# Patient Record
Sex: Female | Born: 1935 | Race: White | Hispanic: No | Smoking: Never smoker
Health system: Southern US, Community
[De-identification: ages and names within clinical notes are randomized; demographics above are authoritative.]

## PROBLEM LIST (undated history)

## (undated) DIAGNOSIS — J189 Pneumonia, unspecified organism: Secondary | ICD-10-CM

## (undated) DIAGNOSIS — G43909 Migraine, unspecified, not intractable, without status migrainosus: Secondary | ICD-10-CM

## (undated) DIAGNOSIS — J40 Bronchitis, not specified as acute or chronic: Secondary | ICD-10-CM

## (undated) DIAGNOSIS — H409 Unspecified glaucoma: Secondary | ICD-10-CM

## (undated) DIAGNOSIS — E785 Hyperlipidemia, unspecified: Secondary | ICD-10-CM

## (undated) DIAGNOSIS — M810 Age-related osteoporosis without current pathological fracture: Secondary | ICD-10-CM

## (undated) DIAGNOSIS — I10 Essential (primary) hypertension: Secondary | ICD-10-CM

## (undated) HISTORY — DX: Essential (primary) hypertension: I10

## (undated) HISTORY — DX: Hyperlipidemia, unspecified: E78.5

## (undated) HISTORY — DX: Migraine, unspecified, not intractable, without status migrainosus: G43.909

## (undated) HISTORY — DX: Unspecified glaucoma: H40.9

## (undated) HISTORY — DX: Bronchitis, not specified as acute or chronic: J40

## (undated) HISTORY — DX: Age-related osteoporosis without current pathological fracture: M81.0

## (undated) HISTORY — PX: APPENDECTOMY: SHX54

## (undated) HISTORY — DX: Pneumonia, unspecified organism: J18.9

---

## 1990-12-16 HISTORY — PX: VAGINAL HYSTERECTOMY: SUR661

## 1998-03-23 ENCOUNTER — Inpatient Hospital Stay (HOSPITAL_COMMUNITY): Admission: AD | Admit: 1998-03-23 | Discharge: 1998-03-25 | Payer: Self-pay

## 1999-01-25 ENCOUNTER — Other Ambulatory Visit: Admission: RE | Admit: 1999-01-25 | Discharge: 1999-01-25 | Payer: Self-pay | Admitting: Obstetrics and Gynecology

## 1999-11-26 ENCOUNTER — Encounter: Payer: Self-pay | Admitting: Internal Medicine

## 1999-11-26 ENCOUNTER — Ambulatory Visit (HOSPITAL_COMMUNITY): Admission: RE | Admit: 1999-11-26 | Discharge: 1999-11-26 | Payer: Self-pay | Admitting: Internal Medicine

## 2002-02-12 ENCOUNTER — Emergency Department (HOSPITAL_COMMUNITY): Admission: EM | Admit: 2002-02-12 | Discharge: 2002-02-12 | Payer: Self-pay | Admitting: Emergency Medicine

## 2003-08-01 ENCOUNTER — Ambulatory Visit (HOSPITAL_COMMUNITY): Admission: RE | Admit: 2003-08-01 | Discharge: 2003-08-01 | Payer: Self-pay | Admitting: Gastroenterology

## 2008-06-24 ENCOUNTER — Encounter: Admission: RE | Admit: 2008-06-24 | Discharge: 2008-06-24 | Payer: Self-pay | Admitting: Family Medicine

## 2010-11-01 ENCOUNTER — Ambulatory Visit (HOSPITAL_COMMUNITY)
Admission: RE | Admit: 2010-11-01 | Discharge: 2010-11-01 | Disposition: A | Discharge status: Home / Self Care | Payer: Medicare Other | Source: Ambulatory Visit | Attending: Gastroenterology | Admitting: Gastroenterology

## 2010-11-01 DIAGNOSIS — R195 Other fecal abnormalities: Secondary | ICD-10-CM | POA: Insufficient documentation

## 2010-11-01 DIAGNOSIS — G43909 Migraine, unspecified, not intractable, without status migrainosus: Secondary | ICD-10-CM | POA: Insufficient documentation

## 2010-11-01 DIAGNOSIS — E039 Hypothyroidism, unspecified: Secondary | ICD-10-CM | POA: Insufficient documentation

## 2010-11-01 DIAGNOSIS — M810 Age-related osteoporosis without current pathological fracture: Secondary | ICD-10-CM | POA: Insufficient documentation

## 2010-11-01 DIAGNOSIS — I10 Essential (primary) hypertension: Secondary | ICD-10-CM | POA: Insufficient documentation

## 2010-11-01 DIAGNOSIS — Z5309 Procedure and treatment not carried out because of other contraindication: Secondary | ICD-10-CM | POA: Insufficient documentation

## 2010-11-02 LAB — POCT I-STAT 4, (NA,K, GLUC, HGB,HCT)
Glucose, Bld: 98 mg/dL (ref 70–99)
HCT: 37 % (ref 36.0–46.0)
Hemoglobin: 12.6 g/dL (ref 12.0–15.0)
Potassium: 3.9 mEq/L (ref 3.5–5.1)
Sodium: 141 mEq/L (ref 135–145)

## 2010-11-02 NOTE — Op Note (Signed)
NAME:  ASHEY, TRAMONTANA                          ACCOUNT NO.:  [id number]   MEDICAL RECORD NO.:  [id number]                   PATIENT TYPE:  AMB   LOCATION:  ENDO                                 FACILITY:  [facility]   PHYSICIAN:  Danise Edge, M.D.                DATE OF BIRTH:  11-16-1922   DATE OF PROCEDURE:  08/01/2003  DATE OF DISCHARGE:                                 OPERATIVE REPORT   PROCEDURE:  Incomplete colonoscopy.   PROCEDURE INDICATION:  Ms. Rhesa Forsberg is a 78 year old female born 1923-01-14.  Ms. Schnackenberg is scheduled to undergo a screening colonoscopy with  polypectomy to prevent colon cancer.  In 1999 her screening flexible  proctosigmoidoscopy was normal.  She has undergone a hysterectomy.   ENDOSCOPIST:  Danise Edge, M.D.   PREMEDICATION:  Demerol 50 mg, Versed 3 mg.   DESCRIPTION OF PROCEDURE:  After obtaining informed consent, Ms. Sanjurjo was  placed in the left lateral decubitus position.  I administered intravenous  Demerol and intravenous Versed to achieve conscious sedation for the  procedure.  The patient's blood pressure, oxygen saturation, and cardiac  rhythm were monitored throughout the procedure and documented in the medical  record.   Anal inspection was normal.  Digital rectal exam was normal.  The Olympus  adjustable pediatric colonoscope was introduced into the rectum and only  advanced to 30 cm from the anal verge.  Due to colonic loop formation which  could not be controlled with applying external abdominal pressure and  repositioning the patient from the left lateral decubitus position to the  supine and finally the right lateral decubitus position, a complete  colonoscopy was not performed.  Colonic preparation for the exam today was  excellent.   Endoscopic appearance of the rectum and distal sigmoid colon with the  colonoscope advanced to 30 cm from the anal verge was completely normal.  There was no endoscopic evidence for the  presence of colorectal neoplasia.   RECOMMENDATIONS:  Perform stool Hemoccult cards.  If positive, schedule air  contrast barium enema.                                               Danise Edge, M.D.    MJ/MEDQ  D:  08/01/2003  T:  08/01/2003  Job:  [id number]   cc:   Dellis Anes. Idell Pickles, M.D.  [address]  [place]  [place] [address]  Fax: [phone]

## 2010-11-15 NOTE — Op Note (Addendum)
  NAMEMarland Kitchen  VALORY, WETHERBY NO.:  [id number]  MEDICAL RECORD NO.:  [id number]  LOCATION:  WLEN                         FACILITY:  [facility]  PHYSICIAN:  Danise Edge, M.D.   DATE OF BIRTH:  05-Oct-1922  DATE OF PROCEDURE:  11/01/2010 DATE OF DISCHARGE:                              OPERATIVE REPORT   HISTORY:  Emily Jackson is an 77 year old female born [birth year]. In February 2005, the patient underwent a normal screening flexible proctosigmoidoscopy.  A screening colonoscopy could not be performed due to sigmoid colon loop formation.  The patient submitted stool for Hemoccult testing associated with her health maintenance physical examination.  Her stool was Hemoccult positive and her hemoglobin was normal.  The patient denied abdominal pain, diarrhea, constipation, or gastrointestinal bleeding.  The patient was scheduled to undergo a diagnostic colonoscopy to evaluate Hemoccult-positive stool.  The colonoscopy could not be performed due to a poorly prepped colon, despite the patient consuming MoviPrep.  ENDOSCOPIST:  Danise Edge, M.D.  PREMEDICATION:  Propofol administered by Anesthesia.  Anal inspection and digital rectal examination were normal.  The Pentax pediatric colonoscope was introduced into the rectum revealing a large amount of solid stool and liquid stool, which could not be cleared.  The procedure was cancelled.  ASSESSMENT: 1. Unexplained Hemoccult-positive stool with normal hemoglobin. 2. Aborted diagnostic colonoscopy due to inadequately prepped colon.  RECOMMENDATIONS:  I will meet with Emily Jackson and discuss a different colon preparation and schedule a repeat colonoscopy.          ______________________________ Danise Edge, M.D.     MJ/MEDQ  D:  11/01/2010  T:  11/01/2010  Job:  [id number]  Electronically Signed by Danise Edge M.D. on 12/27/2010 03:55:57 PM

## 2010-12-13 ENCOUNTER — Emergency Department (HOSPITAL_COMMUNITY)
Admission: EM | Admit: 2010-12-13 | Discharge: 2010-12-13 | Disposition: A | Discharge status: Home / Self Care | Payer: Medicare Other | Attending: Emergency Medicine | Admitting: Emergency Medicine

## 2010-12-13 ENCOUNTER — Ambulatory Visit (HOSPITAL_COMMUNITY)
Admission: RE | Admit: 2010-12-13 | Discharge: 2010-12-13 | Disposition: A | Discharge status: Home / Self Care | Payer: Medicare Other | Source: Ambulatory Visit | Attending: Gastroenterology | Admitting: Gastroenterology

## 2010-12-13 DIAGNOSIS — Z5309 Procedure and treatment not carried out because of other contraindication: Secondary | ICD-10-CM | POA: Insufficient documentation

## 2010-12-13 DIAGNOSIS — R9431 Abnormal electrocardiogram [ECG] [EKG]: Secondary | ICD-10-CM | POA: Insufficient documentation

## 2010-12-13 DIAGNOSIS — K921 Melena: Secondary | ICD-10-CM | POA: Insufficient documentation

## 2010-12-13 DIAGNOSIS — I10 Essential (primary) hypertension: Secondary | ICD-10-CM | POA: Insufficient documentation

## 2010-12-13 DIAGNOSIS — I4891 Unspecified atrial fibrillation: Secondary | ICD-10-CM | POA: Insufficient documentation

## 2010-12-13 DIAGNOSIS — E876 Hypokalemia: Secondary | ICD-10-CM | POA: Insufficient documentation

## 2010-12-13 DIAGNOSIS — R002 Palpitations: Secondary | ICD-10-CM | POA: Insufficient documentation

## 2010-12-13 LAB — DIFFERENTIAL
Basophils Absolute: 0 10*3/uL (ref 0.0–0.1)
Basophils Relative: 1 % (ref 0–1)
Eosinophils Absolute: 0.1 10*3/uL (ref 0.0–0.7)
Eosinophils Relative: 1 % (ref 0–5)
Lymphocytes Relative: 29 % (ref 12–46)
Lymphs Abs: 2.2 10*3/uL (ref 0.7–4.0)
Monocytes Absolute: 0.4 10*3/uL (ref 0.1–1.0)
Monocytes Relative: 5 % (ref 3–12)
Neutro Abs: 4.7 10*3/uL (ref 1.7–7.7)
Neutrophils Relative %: 64 % (ref 43–77)

## 2010-12-13 LAB — COMPREHENSIVE METABOLIC PANEL
ALT: 19 U/L (ref 0–35)
Albumin: 4.7 g/dL (ref 3.5–5.2)
Alkaline Phosphatase: 63 U/L (ref 39–117)
BUN: 12 mg/dL (ref 6–23)
Chloride: 100 mEq/L (ref 96–112)
Potassium: 3.2 mEq/L — ABNORMAL LOW (ref 3.5–5.1)
Sodium: 141 mEq/L (ref 135–145)
Total Bilirubin: 0.6 mg/dL (ref 0.3–1.2)
Total Protein: 7.3 g/dL (ref 6.0–8.3)

## 2010-12-13 LAB — CBC
HCT: 36.6 % (ref 36.0–46.0)
Hemoglobin: 13 g/dL (ref 12.0–15.0)
MCHC: 35.5 g/dL (ref 30.0–36.0)
RDW: 12.3 % (ref 11.5–15.5)
WBC: 7.3 10*3/uL (ref 4.0–10.5)

## 2010-12-13 LAB — TROPONIN I: Troponin I: <0.3 ng/mL (ref <0.30)

## 2010-12-13 LAB — MAGNESIUM: Magnesium: 2 mg/dL (ref 1.5–2.5)

## 2011-01-16 LAB — HM COLONOSCOPY: HM Colonoscopy: NORMAL

## 2011-01-31 ENCOUNTER — Ambulatory Visit (HOSPITAL_COMMUNITY): Payer: Medicare Other

## 2011-01-31 ENCOUNTER — Ambulatory Visit (HOSPITAL_COMMUNITY)
Admission: RE | Admit: 2011-01-31 | Discharge: 2011-01-31 | Disposition: A | Discharge status: Home / Self Care | Payer: Medicare Other | Source: Ambulatory Visit | Attending: Gastroenterology | Admitting: Gastroenterology

## 2011-01-31 DIAGNOSIS — I10 Essential (primary) hypertension: Secondary | ICD-10-CM | POA: Insufficient documentation

## 2011-01-31 DIAGNOSIS — K5730 Diverticulosis of large intestine without perforation or abscess without bleeding: Secondary | ICD-10-CM | POA: Insufficient documentation

## 2011-01-31 DIAGNOSIS — E039 Hypothyroidism, unspecified: Secondary | ICD-10-CM | POA: Insufficient documentation

## 2011-01-31 DIAGNOSIS — Z79899 Other long term (current) drug therapy: Secondary | ICD-10-CM | POA: Insufficient documentation

## 2011-01-31 DIAGNOSIS — K921 Melena: Secondary | ICD-10-CM

## 2011-01-31 DIAGNOSIS — E785 Hyperlipidemia, unspecified: Secondary | ICD-10-CM | POA: Insufficient documentation

## 2011-01-31 DIAGNOSIS — R195 Other fecal abnormalities: Secondary | ICD-10-CM | POA: Insufficient documentation

## 2011-02-07 NOTE — Op Note (Signed)
  Emily Jackson, Emily Jackson NO.:  [id number]  MEDICAL RECORD NO.:  [id number]  LOCATION:  WLEN                         FACILITY:  [facility]  PHYSICIAN:  Danise Edge, M.D.   DATE OF BIRTH:  Dec 14, 1922  DATE OF PROCEDURE:  01/31/2011 DATE OF DISCHARGE:                              OPERATIVE REPORT   REFERRING PHYSICIAN:  Dr. Juluis Rainier  PROCEDURE:  Diagnostic flexible proctosigmoidoscopy.  HISTORY:  Ms. Emily Jackson is an 77 year old female born on 21-Aug-1922. The patient was scheduled to undergo a diagnostic colonoscopy to evaluate Hemoccult-positive stool.  I could only perform a flexible proctosigmoidoscopy to 30 cm due to colonic loop formation.  ENDOSCOPIST:  Danise Edge, MD  PREMEDICATION:  Propofol administered by Anesthesia.  PROCEDURE IN DETAIL:  Anal inspection and digital rectal exam were normal.  A Pentax pediatric colonoscope was introduced into the rectum and advanced to 30 cm from the anal verge.  Due to colonic loop formation, advancement of the endoscope more proximally was not possible.  Colonic preparation was good.  Endoscopic appearance of the rectum and distal sigmoid colon was normal. There was no evidence of colorectal neoplasia.  PLAN:  The patient will be referred to the radiology suite for an air- contrast barium enema.          ______________________________ Danise Edge, M.D.     MJ/MEDQ  D:  01/31/2011  T:  02/01/2011  Job:  [id number]  cc:   Dr. Juluis Rainier  Electronically Signed by Danise Edge M.D. on 02/07/2011 04:48:04 PM

## 2011-02-15 ENCOUNTER — Other Ambulatory Visit: Payer: Medicare Other

## 2011-02-21 ENCOUNTER — Other Ambulatory Visit: Payer: Medicare Other

## 2011-02-22 ENCOUNTER — Ambulatory Visit: Admission: RE | Admit: 2011-02-22 | Payer: Medicare Other | Source: Ambulatory Visit

## 2011-02-27 LAB — HM DEXA SCAN

## 2011-06-20 DIAGNOSIS — M9981 Other biomechanical lesions of cervical region: Secondary | ICD-10-CM | POA: Diagnosis not present

## 2011-06-20 DIAGNOSIS — M999 Biomechanical lesion, unspecified: Secondary | ICD-10-CM | POA: Diagnosis not present

## 2011-06-20 DIAGNOSIS — M5030 Other cervical disc degeneration, unspecified cervical region: Secondary | ICD-10-CM | POA: Diagnosis not present

## 2011-06-26 DIAGNOSIS — Z124 Encounter for screening for malignant neoplasm of cervix: Secondary | ICD-10-CM | POA: Diagnosis not present

## 2011-06-26 DIAGNOSIS — Z01419 Encounter for gynecological examination (general) (routine) without abnormal findings: Secondary | ICD-10-CM | POA: Diagnosis not present

## 2011-07-09 DIAGNOSIS — M999 Biomechanical lesion, unspecified: Secondary | ICD-10-CM | POA: Diagnosis not present

## 2011-07-09 DIAGNOSIS — M9981 Other biomechanical lesions of cervical region: Secondary | ICD-10-CM | POA: Diagnosis not present

## 2011-07-09 DIAGNOSIS — M5030 Other cervical disc degeneration, unspecified cervical region: Secondary | ICD-10-CM | POA: Diagnosis not present

## 2011-07-31 DIAGNOSIS — M5030 Other cervical disc degeneration, unspecified cervical region: Secondary | ICD-10-CM | POA: Diagnosis not present

## 2011-07-31 DIAGNOSIS — M999 Biomechanical lesion, unspecified: Secondary | ICD-10-CM | POA: Diagnosis not present

## 2011-07-31 DIAGNOSIS — M9981 Other biomechanical lesions of cervical region: Secondary | ICD-10-CM | POA: Diagnosis not present

## 2011-08-14 DIAGNOSIS — Z0000 Encounter for general adult medical examination without abnormal findings: Secondary | ICD-10-CM | POA: Diagnosis not present

## 2011-08-14 DIAGNOSIS — E785 Hyperlipidemia, unspecified: Secondary | ICD-10-CM | POA: Diagnosis not present

## 2011-08-14 DIAGNOSIS — Z23 Encounter for immunization: Secondary | ICD-10-CM | POA: Diagnosis not present

## 2011-08-14 DIAGNOSIS — Z79899 Other long term (current) drug therapy: Secondary | ICD-10-CM | POA: Diagnosis not present

## 2011-08-14 DIAGNOSIS — E039 Hypothyroidism, unspecified: Secondary | ICD-10-CM | POA: Diagnosis not present

## 2011-08-14 DIAGNOSIS — M810 Age-related osteoporosis without current pathological fracture: Secondary | ICD-10-CM | POA: Diagnosis not present

## 2011-08-14 DIAGNOSIS — I10 Essential (primary) hypertension: Secondary | ICD-10-CM | POA: Diagnosis not present

## 2011-08-15 DIAGNOSIS — M999 Biomechanical lesion, unspecified: Secondary | ICD-10-CM | POA: Diagnosis not present

## 2011-08-15 DIAGNOSIS — M5030 Other cervical disc degeneration, unspecified cervical region: Secondary | ICD-10-CM | POA: Diagnosis not present

## 2011-08-15 DIAGNOSIS — M9981 Other biomechanical lesions of cervical region: Secondary | ICD-10-CM | POA: Diagnosis not present

## 2011-08-28 DIAGNOSIS — K130 Diseases of lips: Secondary | ICD-10-CM | POA: Diagnosis not present

## 2011-08-28 DIAGNOSIS — L570 Actinic keratosis: Secondary | ICD-10-CM | POA: Diagnosis not present

## 2011-09-02 DIAGNOSIS — E782 Mixed hyperlipidemia: Secondary | ICD-10-CM | POA: Diagnosis not present

## 2011-09-02 DIAGNOSIS — I10 Essential (primary) hypertension: Secondary | ICD-10-CM | POA: Diagnosis not present

## 2011-09-03 DIAGNOSIS — M5030 Other cervical disc degeneration, unspecified cervical region: Secondary | ICD-10-CM | POA: Diagnosis not present

## 2011-09-03 DIAGNOSIS — M999 Biomechanical lesion, unspecified: Secondary | ICD-10-CM | POA: Diagnosis not present

## 2011-09-03 DIAGNOSIS — M9981 Other biomechanical lesions of cervical region: Secondary | ICD-10-CM | POA: Diagnosis not present

## 2011-09-18 DIAGNOSIS — H409 Unspecified glaucoma: Secondary | ICD-10-CM | POA: Diagnosis not present

## 2011-09-18 DIAGNOSIS — H4011X0 Primary open-angle glaucoma, stage unspecified: Secondary | ICD-10-CM | POA: Diagnosis not present

## 2011-09-18 DIAGNOSIS — Z961 Presence of intraocular lens: Secondary | ICD-10-CM | POA: Diagnosis not present

## 2011-09-24 DIAGNOSIS — M999 Biomechanical lesion, unspecified: Secondary | ICD-10-CM | POA: Diagnosis not present

## 2011-09-24 DIAGNOSIS — M9981 Other biomechanical lesions of cervical region: Secondary | ICD-10-CM | POA: Diagnosis not present

## 2011-09-24 DIAGNOSIS — M5030 Other cervical disc degeneration, unspecified cervical region: Secondary | ICD-10-CM | POA: Diagnosis not present

## 2011-10-15 DIAGNOSIS — M999 Biomechanical lesion, unspecified: Secondary | ICD-10-CM | POA: Diagnosis not present

## 2011-10-15 DIAGNOSIS — M9981 Other biomechanical lesions of cervical region: Secondary | ICD-10-CM | POA: Diagnosis not present

## 2011-10-15 DIAGNOSIS — M5030 Other cervical disc degeneration, unspecified cervical region: Secondary | ICD-10-CM | POA: Diagnosis not present

## 2011-10-29 DIAGNOSIS — M9981 Other biomechanical lesions of cervical region: Secondary | ICD-10-CM | POA: Diagnosis not present

## 2011-10-29 DIAGNOSIS — M5030 Other cervical disc degeneration, unspecified cervical region: Secondary | ICD-10-CM | POA: Diagnosis not present

## 2011-10-29 DIAGNOSIS — M999 Biomechanical lesion, unspecified: Secondary | ICD-10-CM | POA: Diagnosis not present

## 2011-11-21 DIAGNOSIS — N189 Chronic kidney disease, unspecified: Secondary | ICD-10-CM | POA: Diagnosis not present

## 2011-11-26 DIAGNOSIS — M9981 Other biomechanical lesions of cervical region: Secondary | ICD-10-CM | POA: Diagnosis not present

## 2011-11-26 DIAGNOSIS — M999 Biomechanical lesion, unspecified: Secondary | ICD-10-CM | POA: Diagnosis not present

## 2011-11-26 DIAGNOSIS — M5030 Other cervical disc degeneration, unspecified cervical region: Secondary | ICD-10-CM | POA: Diagnosis not present

## 2011-12-04 DIAGNOSIS — M9981 Other biomechanical lesions of cervical region: Secondary | ICD-10-CM | POA: Diagnosis not present

## 2011-12-04 DIAGNOSIS — M999 Biomechanical lesion, unspecified: Secondary | ICD-10-CM | POA: Diagnosis not present

## 2011-12-04 DIAGNOSIS — M5030 Other cervical disc degeneration, unspecified cervical region: Secondary | ICD-10-CM | POA: Diagnosis not present

## 2011-12-31 DIAGNOSIS — M9981 Other biomechanical lesions of cervical region: Secondary | ICD-10-CM | POA: Diagnosis not present

## 2011-12-31 DIAGNOSIS — M999 Biomechanical lesion, unspecified: Secondary | ICD-10-CM | POA: Diagnosis not present

## 2011-12-31 DIAGNOSIS — M5030 Other cervical disc degeneration, unspecified cervical region: Secondary | ICD-10-CM | POA: Diagnosis not present

## 2012-01-14 DIAGNOSIS — M5030 Other cervical disc degeneration, unspecified cervical region: Secondary | ICD-10-CM | POA: Diagnosis not present

## 2012-01-14 DIAGNOSIS — M9981 Other biomechanical lesions of cervical region: Secondary | ICD-10-CM | POA: Diagnosis not present

## 2012-01-14 DIAGNOSIS — M999 Biomechanical lesion, unspecified: Secondary | ICD-10-CM | POA: Diagnosis not present

## 2012-02-04 DIAGNOSIS — Z961 Presence of intraocular lens: Secondary | ICD-10-CM | POA: Diagnosis not present

## 2012-02-04 DIAGNOSIS — H4011X0 Primary open-angle glaucoma, stage unspecified: Secondary | ICD-10-CM | POA: Diagnosis not present

## 2012-02-04 DIAGNOSIS — H409 Unspecified glaucoma: Secondary | ICD-10-CM | POA: Diagnosis not present

## 2012-02-05 DIAGNOSIS — M9981 Other biomechanical lesions of cervical region: Secondary | ICD-10-CM | POA: Diagnosis not present

## 2012-02-05 DIAGNOSIS — M999 Biomechanical lesion, unspecified: Secondary | ICD-10-CM | POA: Diagnosis not present

## 2012-02-05 DIAGNOSIS — M5030 Other cervical disc degeneration, unspecified cervical region: Secondary | ICD-10-CM | POA: Diagnosis not present

## 2012-02-19 DIAGNOSIS — M5030 Other cervical disc degeneration, unspecified cervical region: Secondary | ICD-10-CM | POA: Diagnosis not present

## 2012-02-19 DIAGNOSIS — M9981 Other biomechanical lesions of cervical region: Secondary | ICD-10-CM | POA: Diagnosis not present

## 2012-02-19 DIAGNOSIS — M999 Biomechanical lesion, unspecified: Secondary | ICD-10-CM | POA: Diagnosis not present

## 2012-03-05 DIAGNOSIS — M9981 Other biomechanical lesions of cervical region: Secondary | ICD-10-CM | POA: Diagnosis not present

## 2012-03-05 DIAGNOSIS — M5030 Other cervical disc degeneration, unspecified cervical region: Secondary | ICD-10-CM | POA: Diagnosis not present

## 2012-03-05 DIAGNOSIS — M999 Biomechanical lesion, unspecified: Secondary | ICD-10-CM | POA: Diagnosis not present

## 2012-03-19 DIAGNOSIS — Z23 Encounter for immunization: Secondary | ICD-10-CM | POA: Diagnosis not present

## 2012-03-26 DIAGNOSIS — M9981 Other biomechanical lesions of cervical region: Secondary | ICD-10-CM | POA: Diagnosis not present

## 2012-03-26 DIAGNOSIS — M5030 Other cervical disc degeneration, unspecified cervical region: Secondary | ICD-10-CM | POA: Diagnosis not present

## 2012-03-26 DIAGNOSIS — M999 Biomechanical lesion, unspecified: Secondary | ICD-10-CM | POA: Diagnosis not present

## 2012-04-09 DIAGNOSIS — M999 Biomechanical lesion, unspecified: Secondary | ICD-10-CM | POA: Diagnosis not present

## 2012-04-09 DIAGNOSIS — M9981 Other biomechanical lesions of cervical region: Secondary | ICD-10-CM | POA: Diagnosis not present

## 2012-04-09 DIAGNOSIS — M5030 Other cervical disc degeneration, unspecified cervical region: Secondary | ICD-10-CM | POA: Diagnosis not present

## 2012-04-22 DIAGNOSIS — Z1231 Encounter for screening mammogram for malignant neoplasm of breast: Secondary | ICD-10-CM | POA: Diagnosis not present

## 2012-04-22 LAB — HM MAMMOGRAPHY

## 2012-04-30 DIAGNOSIS — M9981 Other biomechanical lesions of cervical region: Secondary | ICD-10-CM | POA: Diagnosis not present

## 2012-04-30 DIAGNOSIS — M999 Biomechanical lesion, unspecified: Secondary | ICD-10-CM | POA: Diagnosis not present

## 2012-04-30 DIAGNOSIS — M5030 Other cervical disc degeneration, unspecified cervical region: Secondary | ICD-10-CM | POA: Diagnosis not present

## 2012-05-13 DIAGNOSIS — J040 Acute laryngitis: Secondary | ICD-10-CM | POA: Diagnosis not present

## 2012-05-17 DIAGNOSIS — J189 Pneumonia, unspecified organism: Secondary | ICD-10-CM

## 2012-05-17 HISTORY — DX: Pneumonia, unspecified organism: J18.9

## 2012-05-18 DIAGNOSIS — M999 Biomechanical lesion, unspecified: Secondary | ICD-10-CM | POA: Diagnosis not present

## 2012-05-18 DIAGNOSIS — M5030 Other cervical disc degeneration, unspecified cervical region: Secondary | ICD-10-CM | POA: Diagnosis not present

## 2012-05-18 DIAGNOSIS — M9981 Other biomechanical lesions of cervical region: Secondary | ICD-10-CM | POA: Diagnosis not present

## 2012-05-26 DIAGNOSIS — J069 Acute upper respiratory infection, unspecified: Secondary | ICD-10-CM | POA: Diagnosis not present

## 2012-05-30 DIAGNOSIS — J159 Unspecified bacterial pneumonia: Secondary | ICD-10-CM | POA: Diagnosis not present

## 2012-05-30 DIAGNOSIS — R05 Cough: Secondary | ICD-10-CM | POA: Diagnosis not present

## 2012-06-23 DIAGNOSIS — Z961 Presence of intraocular lens: Secondary | ICD-10-CM | POA: Diagnosis not present

## 2012-06-23 DIAGNOSIS — H4011X0 Primary open-angle glaucoma, stage unspecified: Secondary | ICD-10-CM | POA: Diagnosis not present

## 2012-06-23 DIAGNOSIS — H409 Unspecified glaucoma: Secondary | ICD-10-CM | POA: Diagnosis not present

## 2012-06-25 DIAGNOSIS — J189 Pneumonia, unspecified organism: Secondary | ICD-10-CM | POA: Diagnosis not present

## 2012-07-13 DIAGNOSIS — R05 Cough: Secondary | ICD-10-CM | POA: Diagnosis not present

## 2012-09-02 DIAGNOSIS — E782 Mixed hyperlipidemia: Secondary | ICD-10-CM | POA: Diagnosis not present

## 2012-09-02 DIAGNOSIS — I10 Essential (primary) hypertension: Secondary | ICD-10-CM | POA: Diagnosis not present

## 2012-09-02 DIAGNOSIS — Z01810 Encounter for preprocedural cardiovascular examination: Secondary | ICD-10-CM | POA: Diagnosis not present

## 2012-09-08 DIAGNOSIS — J069 Acute upper respiratory infection, unspecified: Secondary | ICD-10-CM | POA: Diagnosis not present

## 2012-09-11 DIAGNOSIS — I10 Essential (primary) hypertension: Secondary | ICD-10-CM | POA: Diagnosis not present

## 2012-09-11 DIAGNOSIS — E039 Hypothyroidism, unspecified: Secondary | ICD-10-CM | POA: Diagnosis not present

## 2012-09-11 DIAGNOSIS — Z0000 Encounter for general adult medical examination without abnormal findings: Secondary | ICD-10-CM | POA: Diagnosis not present

## 2012-09-11 DIAGNOSIS — M810 Age-related osteoporosis without current pathological fracture: Secondary | ICD-10-CM | POA: Diagnosis not present

## 2012-09-11 DIAGNOSIS — Z1331 Encounter for screening for depression: Secondary | ICD-10-CM | POA: Diagnosis not present

## 2012-09-11 DIAGNOSIS — Z79899 Other long term (current) drug therapy: Secondary | ICD-10-CM | POA: Diagnosis not present

## 2012-09-11 DIAGNOSIS — E785 Hyperlipidemia, unspecified: Secondary | ICD-10-CM | POA: Diagnosis not present

## 2012-09-11 DIAGNOSIS — J40 Bronchitis, not specified as acute or chronic: Secondary | ICD-10-CM | POA: Diagnosis not present

## 2012-09-24 ENCOUNTER — Encounter: Payer: Self-pay | Admitting: Obstetrics and Gynecology

## 2012-09-24 DIAGNOSIS — D320 Benign neoplasm of cerebral meninges: Secondary | ICD-10-CM | POA: Diagnosis not present

## 2012-09-24 DIAGNOSIS — M461 Sacroiliitis, not elsewhere classified: Secondary | ICD-10-CM | POA: Diagnosis not present

## 2012-09-24 DIAGNOSIS — M5030 Other cervical disc degeneration, unspecified cervical region: Secondary | ICD-10-CM | POA: Diagnosis not present

## 2012-09-24 DIAGNOSIS — M999 Biomechanical lesion, unspecified: Secondary | ICD-10-CM | POA: Diagnosis not present

## 2012-09-24 DIAGNOSIS — M9981 Other biomechanical lesions of cervical region: Secondary | ICD-10-CM | POA: Diagnosis not present

## 2012-09-25 ENCOUNTER — Encounter: Payer: Self-pay | Admitting: Obstetrics and Gynecology

## 2012-09-25 ENCOUNTER — Ambulatory Visit (INDEPENDENT_AMBULATORY_CARE_PROVIDER_SITE_OTHER): Payer: Medicare Other | Admitting: Obstetrics and Gynecology

## 2012-09-25 VITALS — BP 138/64 | Ht 60.25 in | Wt 107.0 lb

## 2012-09-25 DIAGNOSIS — Z124 Encounter for screening for malignant neoplasm of cervix: Secondary | ICD-10-CM

## 2012-09-25 DIAGNOSIS — Z01419 Encounter for gynecological examination (general) (routine) without abnormal findings: Secondary | ICD-10-CM

## 2012-09-25 NOTE — Patient Instructions (Addendum)

## 2012-09-25 NOTE — Progress Notes (Signed)
79 y.o.  Married  Caucasian female   G2P2 here for annual exam.  Known osteoporosis, last BMD Sept 2012, stable since off actonel for two years.Vit D last week was mid 40's.   Had pneumonia in Dec and was not hospitalized but has had 3 courses of antibiotics.  Still feels fatigued.  No vag bleeding.    No LMP recorded. Patient has had a hysterectomy.          Sexually active: no  The current method of family planning is abstinence.    Exercising: occ walking Last mammogram:  04/22/12 normal Last pap smear: ? History of abnormal pap: none Smoking:no Alcohol: alcohol, wine 4-5 times a week Last colonoscopy:01/16/2011 normal Last Bone Density:  02/27/11 stable (osteoporosis) Last tetanus shot: 2013 Last cholesterol check: 08/28/12 normal on tx  Hgb:   pcp             Urine:pcp    No health maintenance topics applied.  Family History  Problem Relation Age of Onset  . Hypertension Mother   . Hypertension Father   . Heart disease Father     There is no problem list on file for this patient.   Past Medical History  Diagnosis Date  . Pneumonia 05/2012  . Glaucoma   . Hypertension   . Migraines   . Hyperlipemia     Past Surgical History  Procedure Laterality Date  . Abdominal hysterectomy      BSO,A&P repair  . Appendectomy      Allergies: Sulfa antibiotics  Current Outpatient Prescriptions  Medication Sig Dispense Refill  . amLODipine (NORVASC) 5 MG tablet Take 5 mg by mouth 2 (two) times daily.      . Ascorbic Acid (VITAMIN C PO) Take by mouth daily.      . ATENOLOL PO Take 50 mg by mouth daily.       Marland Kitchen atorvastatin (LIPITOR) 10 MG tablet Take 10 mg by mouth daily.      . bimatoprost (LUMIGAN) 0.01 % SOLN 1 drop at bedtime.      . brimonidine-timolol (COMBIGAN) 0.2-0.5 % ophthalmic solution Place 1 drop into both eyes every 12 (twelve) hours.      . butalbital-aspirin-caffeine (FIORINAL) 50-325-40 MG per capsule Take 1 capsule by mouth as needed for headache.      Marland Kitchen  CALCIUM PO Take 1,200 mg by mouth daily.      . cetirizine (ZYRTEC) 10 MG tablet Take 10 mg by mouth daily.      . fluticasone (FLONASE) 50 MCG/ACT nasal spray Place 2 sprays into the nose daily.      Marland Kitchen gabapentin (NEURONTIN) 600 MG tablet Take 600 mg by mouth daily.      Marland Kitchen ipratropium (ATROVENT) 0.03 % nasal spray Place 2 sprays into the nose every 12 (twelve) hours.      Marland Kitchen levothyroxine (SYNTHROID, LEVOTHROID) 50 MCG tablet Take 50 mcg by mouth daily before breakfast.      . lisinopril (PRINIVIL,ZESTRIL) 20 MG tablet Take 20 mg by mouth 2 (two) times daily.       No current facility-administered medications for this visit.    ROS: Pertinent items are noted in HPI.  Social Hx:  Married , two children.  Husband turns 7 this year and children in [place] are planning a family gathering to celebrate.  Exam:    BP 138/64  Ht 5' 0.25" (1.53 m)  Wt 107 lb (48.535 kg)  BMI 20.73 kg/m2  Down 5 pounds from  last year.  Pt ascribes to coughing with pneumonia. Wt Readings from Last 3 Encounters:  09/25/12 107 lb (48.535 kg)     Ht Readings from Last 3 Encounters:  09/25/12 5' 0.25" (1.53 m)  Height stable from last year.    General appearance: alert, cooperative and appears stated age Head: Normocephalic, without obvious abnormality, atraumatic Neck: no adenopathy, supple, symmetrical, trachea midline and thyroid not enlarged, symmetric, no tenderness/mass/nodules Lungs: clear to auscultation bilaterally Breasts: Inspection negative, No nipple retraction or dimpling, No nipple discharge or bleeding, No axillary or supraclavicular adenopathy, Normal to palpation without dominant masses Heart: regular rate and rhythm Abdomen: soft, non-tender; bowel sounds normal; no masses,  no organomegaly Extremities: extremities normal, atraumatic, no cyanosis or edema Skin: Skin color, texture, turgor normal. No rashes or lesions Lymph nodes: Cervical, supraclavicular, and axillary nodes normal. No  abnormal inguinal nodes palpated Neurologic: Grossly normal   Pelvic: External genitalia:  no lesions              Urethra:  normal appearing urethra with no masses, tenderness or lesions              Bartholins and Skenes: normal                 Vagina: normal appearing vagina with normal color and discharge, no lesions, slightly atrophic               Pap taken: no        Bimanual Exam:  Uterus: surgically absent                                      Adnexa: nontender and no masses                                      Rectovaginal: Confirms                                      Anus:  normal sphincter tone, no lesions  A: normal menopausal exam;  S/p TVH, A & P repair, BSO     Osteoporosis; quit actonel Nov 2010 after 9 years of use     P: mammogram counseled on breast self exam, mammography screening, osteoporosis, adequate intake of calcium and vitamin D, diet and exercise return annually or prn   BMD this fall   An After Visit Summary was printed and given to the patient.

## 2012-10-15 DIAGNOSIS — M999 Biomechanical lesion, unspecified: Secondary | ICD-10-CM | POA: Diagnosis not present

## 2012-10-15 DIAGNOSIS — M5030 Other cervical disc degeneration, unspecified cervical region: Secondary | ICD-10-CM | POA: Diagnosis not present

## 2012-10-15 DIAGNOSIS — M461 Sacroiliitis, not elsewhere classified: Secondary | ICD-10-CM | POA: Diagnosis not present

## 2012-10-15 DIAGNOSIS — M9981 Other biomechanical lesions of cervical region: Secondary | ICD-10-CM | POA: Diagnosis not present

## 2012-10-15 DIAGNOSIS — M5137 Other intervertebral disc degeneration, lumbosacral region: Secondary | ICD-10-CM | POA: Diagnosis not present

## 2012-10-21 DIAGNOSIS — Z961 Presence of intraocular lens: Secondary | ICD-10-CM | POA: Diagnosis not present

## 2012-10-21 DIAGNOSIS — H4011X0 Primary open-angle glaucoma, stage unspecified: Secondary | ICD-10-CM | POA: Diagnosis not present

## 2012-10-29 DIAGNOSIS — M461 Sacroiliitis, not elsewhere classified: Secondary | ICD-10-CM | POA: Diagnosis not present

## 2012-10-29 DIAGNOSIS — M9981 Other biomechanical lesions of cervical region: Secondary | ICD-10-CM | POA: Diagnosis not present

## 2012-10-29 DIAGNOSIS — M5137 Other intervertebral disc degeneration, lumbosacral region: Secondary | ICD-10-CM | POA: Diagnosis not present

## 2012-10-29 DIAGNOSIS — M5030 Other cervical disc degeneration, unspecified cervical region: Secondary | ICD-10-CM | POA: Diagnosis not present

## 2012-10-29 DIAGNOSIS — M999 Biomechanical lesion, unspecified: Secondary | ICD-10-CM | POA: Diagnosis not present

## 2012-11-03 DIAGNOSIS — L219 Seborrheic dermatitis, unspecified: Secondary | ICD-10-CM | POA: Diagnosis not present

## 2012-11-03 DIAGNOSIS — L608 Other nail disorders: Secondary | ICD-10-CM | POA: Diagnosis not present

## 2012-11-03 DIAGNOSIS — D239 Other benign neoplasm of skin, unspecified: Secondary | ICD-10-CM | POA: Diagnosis not present

## 2012-11-12 DIAGNOSIS — M461 Sacroiliitis, not elsewhere classified: Secondary | ICD-10-CM | POA: Diagnosis not present

## 2012-11-12 DIAGNOSIS — M5030 Other cervical disc degeneration, unspecified cervical region: Secondary | ICD-10-CM | POA: Diagnosis not present

## 2012-11-12 DIAGNOSIS — M5137 Other intervertebral disc degeneration, lumbosacral region: Secondary | ICD-10-CM | POA: Diagnosis not present

## 2012-11-12 DIAGNOSIS — M9981 Other biomechanical lesions of cervical region: Secondary | ICD-10-CM | POA: Diagnosis not present

## 2012-11-12 DIAGNOSIS — M999 Biomechanical lesion, unspecified: Secondary | ICD-10-CM | POA: Diagnosis not present

## 2012-11-17 ENCOUNTER — Other Ambulatory Visit: Payer: Self-pay | Admitting: *Deleted

## 2012-11-17 MED ORDER — ATENOLOL 50 MG PO TABS: ORAL_TABLET | ORAL | Status: DC

## 2012-11-17 NOTE — Telephone Encounter (Signed)
Rx refill for atenolol

## 2012-11-26 DIAGNOSIS — M5030 Other cervical disc degeneration, unspecified cervical region: Secondary | ICD-10-CM | POA: Diagnosis not present

## 2012-11-26 DIAGNOSIS — M999 Biomechanical lesion, unspecified: Secondary | ICD-10-CM | POA: Diagnosis not present

## 2012-11-26 DIAGNOSIS — M5137 Other intervertebral disc degeneration, lumbosacral region: Secondary | ICD-10-CM | POA: Diagnosis not present

## 2012-11-26 DIAGNOSIS — M9981 Other biomechanical lesions of cervical region: Secondary | ICD-10-CM | POA: Diagnosis not present

## 2012-11-26 DIAGNOSIS — M461 Sacroiliitis, not elsewhere classified: Secondary | ICD-10-CM | POA: Diagnosis not present

## 2012-12-10 DIAGNOSIS — M9981 Other biomechanical lesions of cervical region: Secondary | ICD-10-CM | POA: Diagnosis not present

## 2012-12-10 DIAGNOSIS — M5030 Other cervical disc degeneration, unspecified cervical region: Secondary | ICD-10-CM | POA: Diagnosis not present

## 2012-12-10 DIAGNOSIS — M461 Sacroiliitis, not elsewhere classified: Secondary | ICD-10-CM | POA: Diagnosis not present

## 2012-12-10 DIAGNOSIS — M5137 Other intervertebral disc degeneration, lumbosacral region: Secondary | ICD-10-CM | POA: Diagnosis not present

## 2012-12-10 DIAGNOSIS — M999 Biomechanical lesion, unspecified: Secondary | ICD-10-CM | POA: Diagnosis not present

## 2013-02-04 DIAGNOSIS — M461 Sacroiliitis, not elsewhere classified: Secondary | ICD-10-CM | POA: Diagnosis not present

## 2013-02-04 DIAGNOSIS — M999 Biomechanical lesion, unspecified: Secondary | ICD-10-CM | POA: Diagnosis not present

## 2013-02-04 DIAGNOSIS — M5137 Other intervertebral disc degeneration, lumbosacral region: Secondary | ICD-10-CM | POA: Diagnosis not present

## 2013-02-04 DIAGNOSIS — M9981 Other biomechanical lesions of cervical region: Secondary | ICD-10-CM | POA: Diagnosis not present

## 2013-02-04 DIAGNOSIS — M5030 Other cervical disc degeneration, unspecified cervical region: Secondary | ICD-10-CM | POA: Diagnosis not present

## 2013-02-17 DIAGNOSIS — H4011X0 Primary open-angle glaucoma, stage unspecified: Secondary | ICD-10-CM | POA: Diagnosis not present

## 2013-02-17 DIAGNOSIS — Z961 Presence of intraocular lens: Secondary | ICD-10-CM | POA: Diagnosis not present

## 2013-02-22 DIAGNOSIS — M5030 Other cervical disc degeneration, unspecified cervical region: Secondary | ICD-10-CM | POA: Diagnosis not present

## 2013-02-22 DIAGNOSIS — M999 Biomechanical lesion, unspecified: Secondary | ICD-10-CM | POA: Diagnosis not present

## 2013-02-22 DIAGNOSIS — M5137 Other intervertebral disc degeneration, lumbosacral region: Secondary | ICD-10-CM | POA: Diagnosis not present

## 2013-02-22 DIAGNOSIS — M9981 Other biomechanical lesions of cervical region: Secondary | ICD-10-CM | POA: Diagnosis not present

## 2013-02-22 DIAGNOSIS — M461 Sacroiliitis, not elsewhere classified: Secondary | ICD-10-CM | POA: Diagnosis not present

## 2013-03-08 DIAGNOSIS — M5137 Other intervertebral disc degeneration, lumbosacral region: Secondary | ICD-10-CM | POA: Diagnosis not present

## 2013-03-08 DIAGNOSIS — M9981 Other biomechanical lesions of cervical region: Secondary | ICD-10-CM | POA: Diagnosis not present

## 2013-03-08 DIAGNOSIS — M999 Biomechanical lesion, unspecified: Secondary | ICD-10-CM | POA: Diagnosis not present

## 2013-03-08 DIAGNOSIS — M5030 Other cervical disc degeneration, unspecified cervical region: Secondary | ICD-10-CM | POA: Diagnosis not present

## 2013-03-08 DIAGNOSIS — M461 Sacroiliitis, not elsewhere classified: Secondary | ICD-10-CM | POA: Diagnosis not present

## 2013-03-22 DIAGNOSIS — M5030 Other cervical disc degeneration, unspecified cervical region: Secondary | ICD-10-CM | POA: Diagnosis not present

## 2013-03-22 DIAGNOSIS — M461 Sacroiliitis, not elsewhere classified: Secondary | ICD-10-CM | POA: Diagnosis not present

## 2013-03-22 DIAGNOSIS — M5137 Other intervertebral disc degeneration, lumbosacral region: Secondary | ICD-10-CM | POA: Diagnosis not present

## 2013-03-22 DIAGNOSIS — M9981 Other biomechanical lesions of cervical region: Secondary | ICD-10-CM | POA: Diagnosis not present

## 2013-03-22 DIAGNOSIS — M999 Biomechanical lesion, unspecified: Secondary | ICD-10-CM | POA: Diagnosis not present

## 2013-03-25 DIAGNOSIS — Z23 Encounter for immunization: Secondary | ICD-10-CM | POA: Diagnosis not present

## 2013-04-01 DIAGNOSIS — I10 Essential (primary) hypertension: Secondary | ICD-10-CM | POA: Diagnosis not present

## 2013-04-01 DIAGNOSIS — E785 Hyperlipidemia, unspecified: Secondary | ICD-10-CM | POA: Diagnosis not present

## 2013-04-01 DIAGNOSIS — Z79899 Other long term (current) drug therapy: Secondary | ICD-10-CM | POA: Diagnosis not present

## 2013-04-01 DIAGNOSIS — R234 Changes in skin texture: Secondary | ICD-10-CM | POA: Diagnosis not present

## 2013-04-08 DIAGNOSIS — M5137 Other intervertebral disc degeneration, lumbosacral region: Secondary | ICD-10-CM | POA: Diagnosis not present

## 2013-04-08 DIAGNOSIS — M9981 Other biomechanical lesions of cervical region: Secondary | ICD-10-CM | POA: Diagnosis not present

## 2013-04-08 DIAGNOSIS — M999 Biomechanical lesion, unspecified: Secondary | ICD-10-CM | POA: Diagnosis not present

## 2013-04-08 DIAGNOSIS — M5030 Other cervical disc degeneration, unspecified cervical region: Secondary | ICD-10-CM | POA: Diagnosis not present

## 2013-04-08 DIAGNOSIS — M461 Sacroiliitis, not elsewhere classified: Secondary | ICD-10-CM | POA: Diagnosis not present

## 2013-04-22 DIAGNOSIS — M5137 Other intervertebral disc degeneration, lumbosacral region: Secondary | ICD-10-CM | POA: Diagnosis not present

## 2013-04-22 DIAGNOSIS — M999 Biomechanical lesion, unspecified: Secondary | ICD-10-CM | POA: Diagnosis not present

## 2013-04-22 DIAGNOSIS — M461 Sacroiliitis, not elsewhere classified: Secondary | ICD-10-CM | POA: Diagnosis not present

## 2013-04-22 DIAGNOSIS — M9981 Other biomechanical lesions of cervical region: Secondary | ICD-10-CM | POA: Diagnosis not present

## 2013-04-22 DIAGNOSIS — M5030 Other cervical disc degeneration, unspecified cervical region: Secondary | ICD-10-CM | POA: Diagnosis not present

## 2013-05-06 DIAGNOSIS — M999 Biomechanical lesion, unspecified: Secondary | ICD-10-CM | POA: Diagnosis not present

## 2013-05-06 DIAGNOSIS — M5137 Other intervertebral disc degeneration, lumbosacral region: Secondary | ICD-10-CM | POA: Diagnosis not present

## 2013-05-06 DIAGNOSIS — M461 Sacroiliitis, not elsewhere classified: Secondary | ICD-10-CM | POA: Diagnosis not present

## 2013-05-06 DIAGNOSIS — M9981 Other biomechanical lesions of cervical region: Secondary | ICD-10-CM | POA: Diagnosis not present

## 2013-05-06 DIAGNOSIS — M5030 Other cervical disc degeneration, unspecified cervical region: Secondary | ICD-10-CM | POA: Diagnosis not present

## 2013-05-20 DIAGNOSIS — M5030 Other cervical disc degeneration, unspecified cervical region: Secondary | ICD-10-CM | POA: Diagnosis not present

## 2013-05-20 DIAGNOSIS — M999 Biomechanical lesion, unspecified: Secondary | ICD-10-CM | POA: Diagnosis not present

## 2013-05-20 DIAGNOSIS — M461 Sacroiliitis, not elsewhere classified: Secondary | ICD-10-CM | POA: Diagnosis not present

## 2013-05-20 DIAGNOSIS — M9981 Other biomechanical lesions of cervical region: Secondary | ICD-10-CM | POA: Diagnosis not present

## 2013-05-20 DIAGNOSIS — M5137 Other intervertebral disc degeneration, lumbosacral region: Secondary | ICD-10-CM | POA: Diagnosis not present

## 2013-05-28 ENCOUNTER — Other Ambulatory Visit: Payer: Self-pay

## 2013-05-28 MED ORDER — AMLODIPINE BESYLATE 5 MG PO TABS: 5.0000 mg | ORAL_TABLET | Freq: Two times a day (BID) | ORAL | Status: DC

## 2013-05-28 NOTE — Telephone Encounter (Signed)
Rx was sent to pharmacy electronically. 

## 2013-06-03 DIAGNOSIS — M9981 Other biomechanical lesions of cervical region: Secondary | ICD-10-CM | POA: Diagnosis not present

## 2013-06-03 DIAGNOSIS — M5030 Other cervical disc degeneration, unspecified cervical region: Secondary | ICD-10-CM | POA: Diagnosis not present

## 2013-06-03 DIAGNOSIS — M5137 Other intervertebral disc degeneration, lumbosacral region: Secondary | ICD-10-CM | POA: Diagnosis not present

## 2013-06-03 DIAGNOSIS — M461 Sacroiliitis, not elsewhere classified: Secondary | ICD-10-CM | POA: Diagnosis not present

## 2013-06-03 DIAGNOSIS — M999 Biomechanical lesion, unspecified: Secondary | ICD-10-CM | POA: Diagnosis not present

## 2013-06-17 DIAGNOSIS — J40 Bronchitis, not specified as acute or chronic: Secondary | ICD-10-CM

## 2013-06-17 HISTORY — DX: Bronchitis, not specified as acute or chronic: J40

## 2013-06-23 DIAGNOSIS — Z961 Presence of intraocular lens: Secondary | ICD-10-CM | POA: Diagnosis not present

## 2013-06-23 DIAGNOSIS — H4011X0 Primary open-angle glaucoma, stage unspecified: Secondary | ICD-10-CM | POA: Diagnosis not present

## 2013-06-28 DIAGNOSIS — J069 Acute upper respiratory infection, unspecified: Secondary | ICD-10-CM | POA: Diagnosis not present

## 2013-07-06 DIAGNOSIS — M5137 Other intervertebral disc degeneration, lumbosacral region: Secondary | ICD-10-CM | POA: Diagnosis not present

## 2013-07-06 DIAGNOSIS — M5030 Other cervical disc degeneration, unspecified cervical region: Secondary | ICD-10-CM | POA: Diagnosis not present

## 2013-07-06 DIAGNOSIS — M999 Biomechanical lesion, unspecified: Secondary | ICD-10-CM | POA: Diagnosis not present

## 2013-07-06 DIAGNOSIS — M461 Sacroiliitis, not elsewhere classified: Secondary | ICD-10-CM | POA: Diagnosis not present

## 2013-07-06 DIAGNOSIS — M9981 Other biomechanical lesions of cervical region: Secondary | ICD-10-CM | POA: Diagnosis not present

## 2013-07-16 DIAGNOSIS — R05 Cough: Secondary | ICD-10-CM | POA: Diagnosis not present

## 2013-07-16 DIAGNOSIS — J40 Bronchitis, not specified as acute or chronic: Secondary | ICD-10-CM | POA: Diagnosis not present

## 2013-07-16 DIAGNOSIS — R059 Cough, unspecified: Secondary | ICD-10-CM | POA: Diagnosis not present

## 2013-07-22 IMAGING — CR DG BE W/ AIR HIGH DENSITY
8 of 9 series · 8 of 9 positions shown · non-contrast
Comparison: None.

CLINICAL DATA: Guaiac positive stool.  Incomplete colonoscopy.

AIR-CONTRAST BARIUM ENEMA
TECHNIQUE: After obtaining a scout radiograph air-contrast barium
enema was performed under fluoroscopy using high-density barium.
Fluoroscopy Time: 4.7 minutes

[view not recorded (1 of 8)]
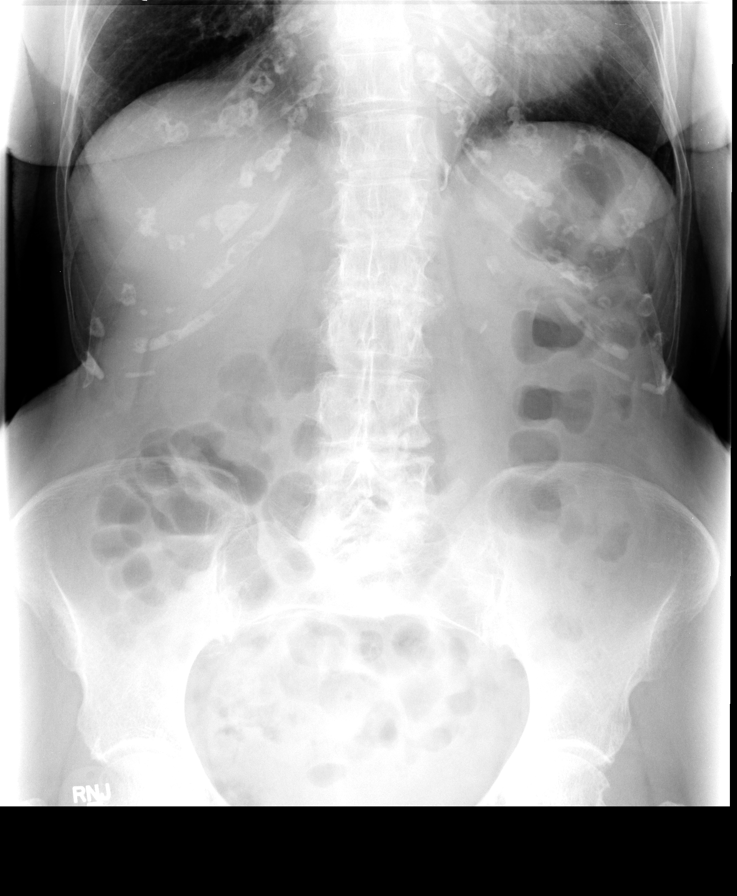

[view not recorded (2 of 8)]
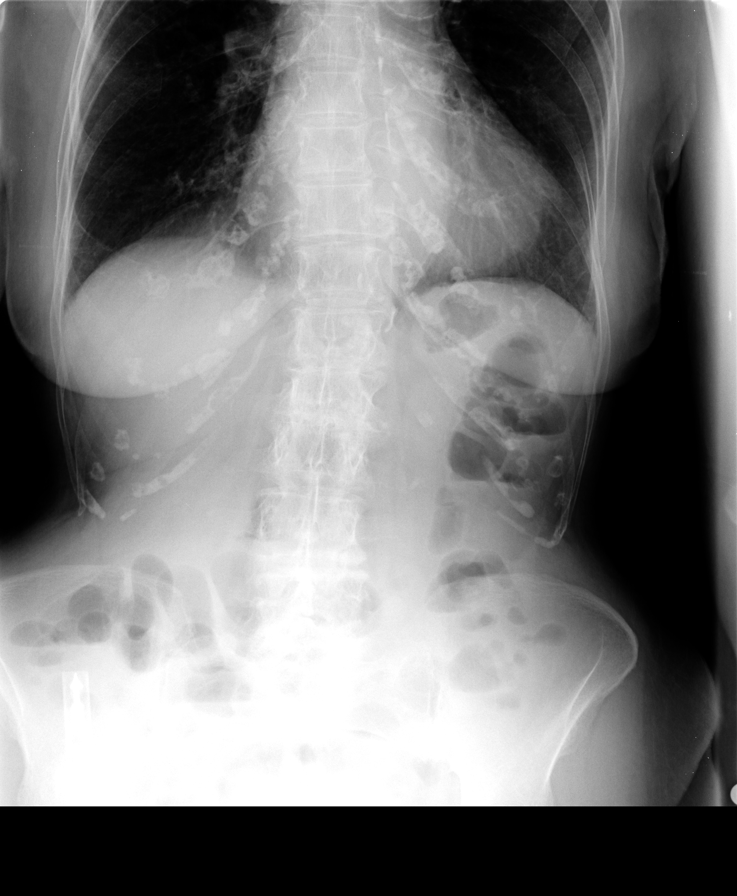

[view not recorded (3 of 8)]
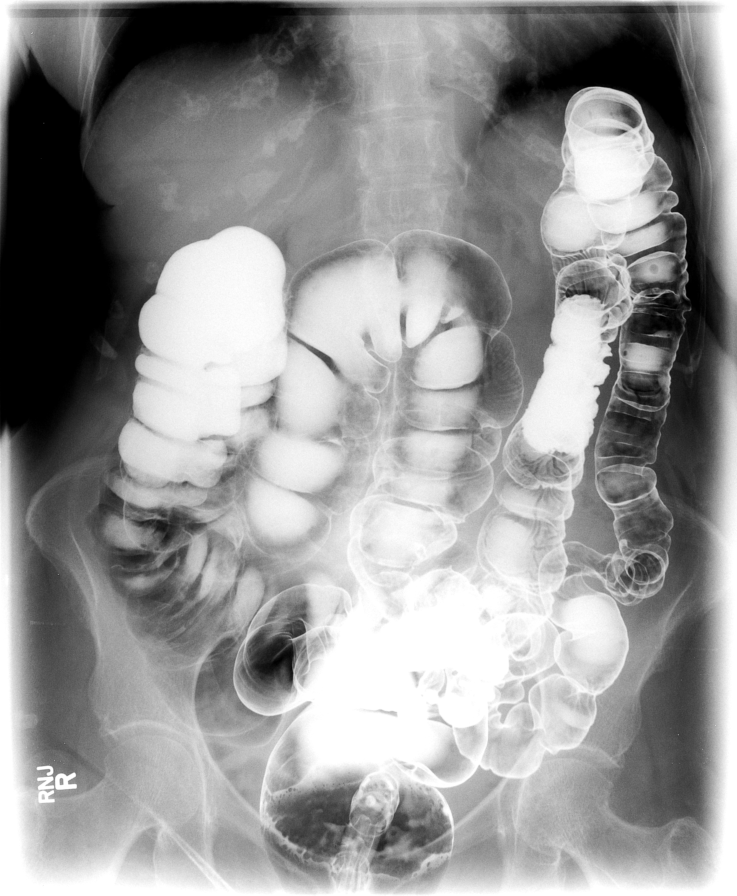

[view not recorded (4 of 8)]
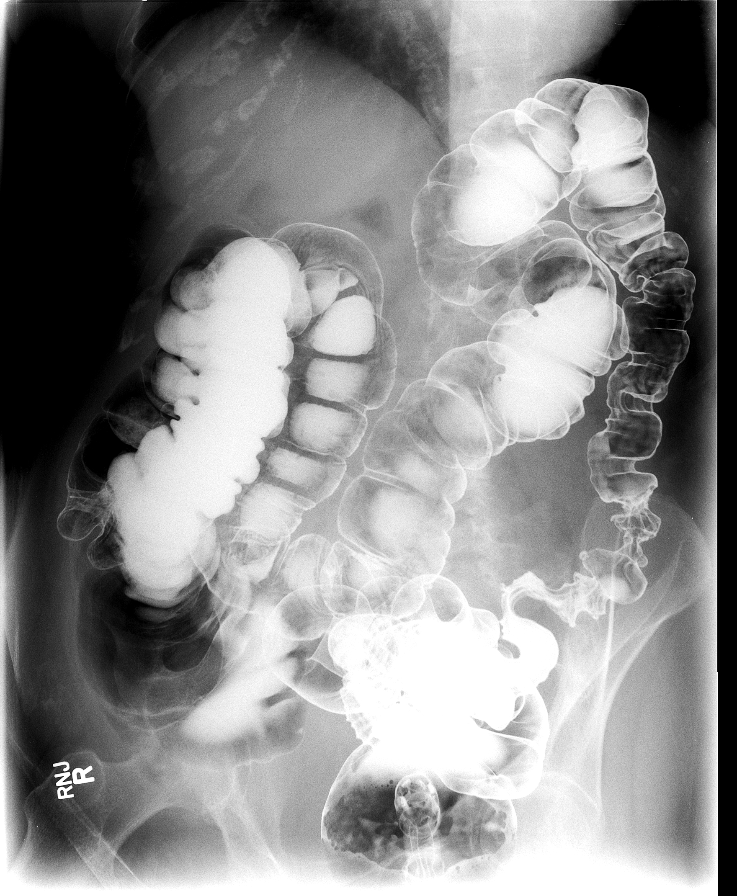

[view not recorded (5 of 8)]
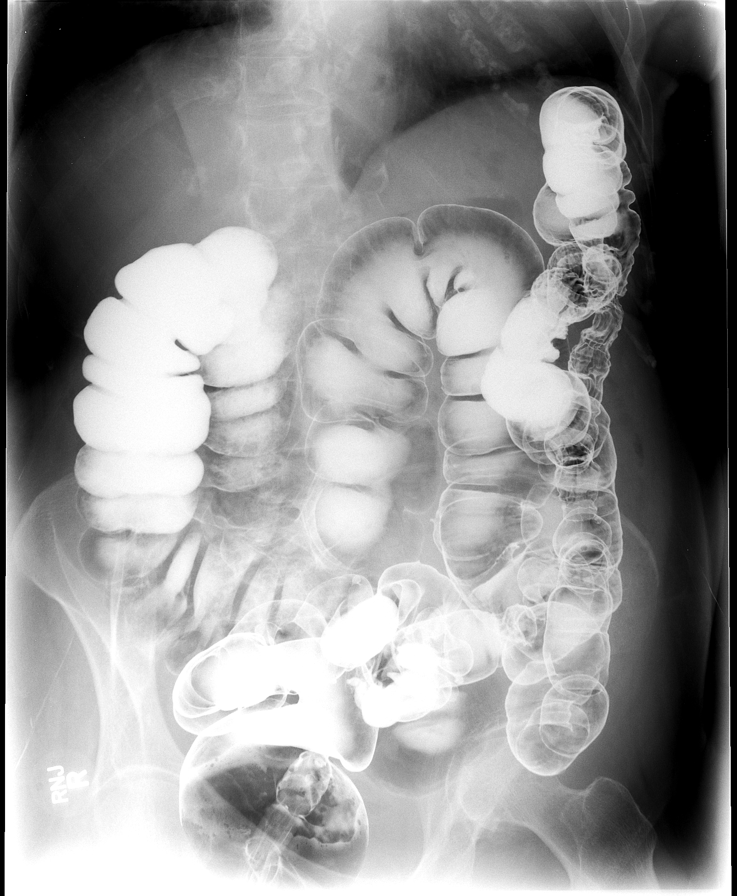

[view not recorded (6 of 8)]
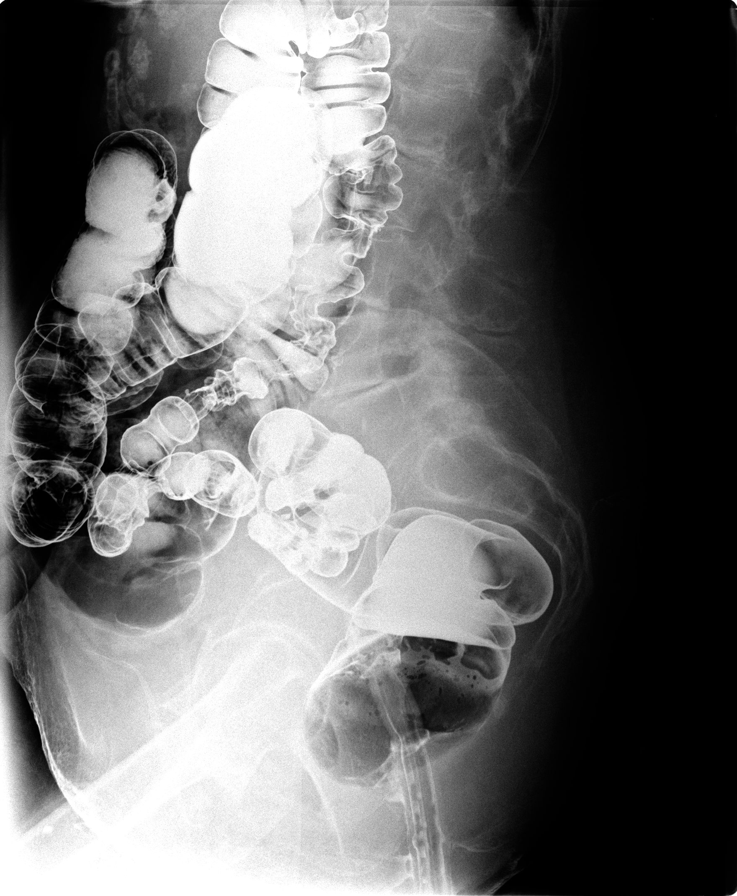

[view not recorded (7 of 8)]
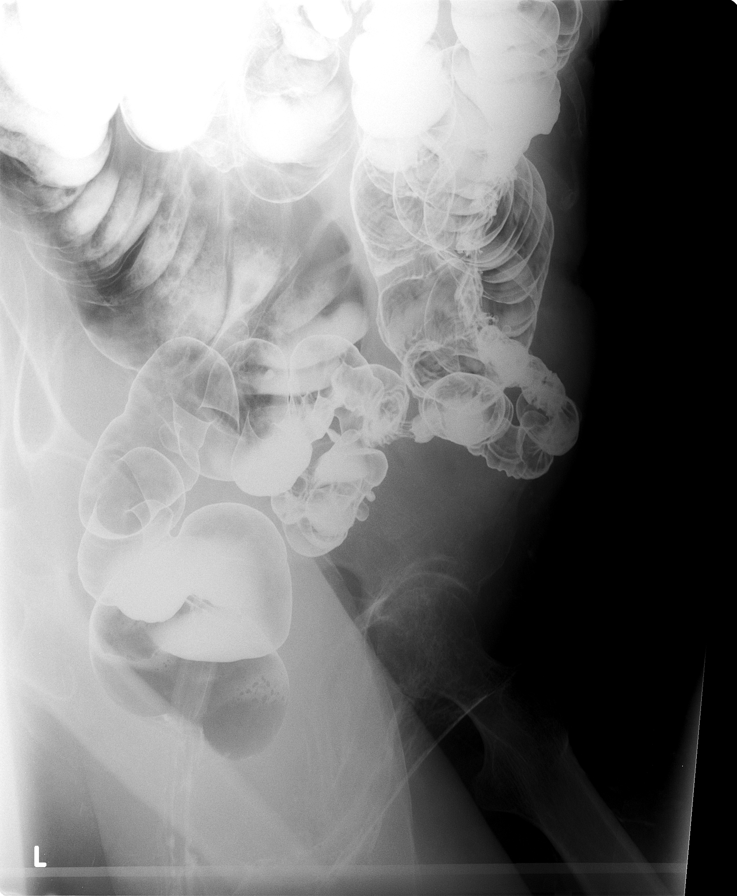

[view not recorded (8 of 8)]
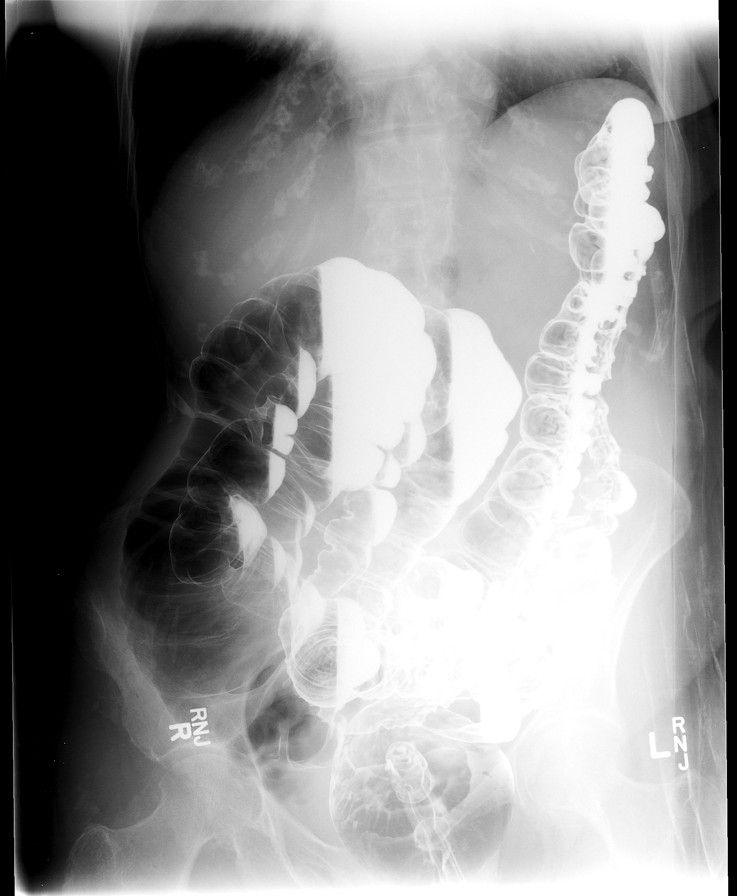

[8 of 9 positions shown; findings below may reference images not displayed]

FINDINGS: Scout radiograph show no evidence of dilated bowel loops
or free intraperitoneal air status post colonoscopy.

Air contrast barium enema shows free retrograde filling of the
entire colon to the level of cecum.  No annular constricting
lesions are seen involving the colon.  A small amount of residual
stool is noted in the right colon, which does not significantly
compromise the exam.  Mild diverticulosis is seen involving the
sigmoid colon, however there are is no evidence of diverticulitis.
No intraluminal masses or mucosal polyps are seen throughout the
colon.
IMPRESSION: Mild sigmoid diverticulosis.  No colonic masses or polyps
identified.

## 2013-07-27 DIAGNOSIS — M461 Sacroiliitis, not elsewhere classified: Secondary | ICD-10-CM | POA: Diagnosis not present

## 2013-07-27 DIAGNOSIS — M999 Biomechanical lesion, unspecified: Secondary | ICD-10-CM | POA: Diagnosis not present

## 2013-07-27 DIAGNOSIS — M5137 Other intervertebral disc degeneration, lumbosacral region: Secondary | ICD-10-CM | POA: Diagnosis not present

## 2013-07-27 DIAGNOSIS — M5030 Other cervical disc degeneration, unspecified cervical region: Secondary | ICD-10-CM | POA: Diagnosis not present

## 2013-07-27 DIAGNOSIS — M9981 Other biomechanical lesions of cervical region: Secondary | ICD-10-CM | POA: Diagnosis not present

## 2013-08-17 DIAGNOSIS — M5030 Other cervical disc degeneration, unspecified cervical region: Secondary | ICD-10-CM | POA: Diagnosis not present

## 2013-08-17 DIAGNOSIS — M9981 Other biomechanical lesions of cervical region: Secondary | ICD-10-CM | POA: Diagnosis not present

## 2013-08-17 DIAGNOSIS — M461 Sacroiliitis, not elsewhere classified: Secondary | ICD-10-CM | POA: Diagnosis not present

## 2013-08-17 DIAGNOSIS — M5137 Other intervertebral disc degeneration, lumbosacral region: Secondary | ICD-10-CM | POA: Diagnosis not present

## 2013-08-17 DIAGNOSIS — M999 Biomechanical lesion, unspecified: Secondary | ICD-10-CM | POA: Diagnosis not present

## 2013-08-20 DIAGNOSIS — Z1231 Encounter for screening mammogram for malignant neoplasm of breast: Secondary | ICD-10-CM | POA: Diagnosis not present

## 2013-08-31 DIAGNOSIS — M5137 Other intervertebral disc degeneration, lumbosacral region: Secondary | ICD-10-CM | POA: Diagnosis not present

## 2013-08-31 DIAGNOSIS — M5030 Other cervical disc degeneration, unspecified cervical region: Secondary | ICD-10-CM | POA: Diagnosis not present

## 2013-08-31 DIAGNOSIS — M9981 Other biomechanical lesions of cervical region: Secondary | ICD-10-CM | POA: Diagnosis not present

## 2013-08-31 DIAGNOSIS — M461 Sacroiliitis, not elsewhere classified: Secondary | ICD-10-CM | POA: Diagnosis not present

## 2013-08-31 DIAGNOSIS — M999 Biomechanical lesion, unspecified: Secondary | ICD-10-CM | POA: Diagnosis not present

## 2013-09-08 ENCOUNTER — Ambulatory Visit (INDEPENDENT_AMBULATORY_CARE_PROVIDER_SITE_OTHER): Payer: Medicare Other | Admitting: Cardiovascular Disease

## 2013-09-08 ENCOUNTER — Encounter: Payer: Self-pay | Admitting: Cardiovascular Disease

## 2013-09-08 VITALS — BP 140/72 | Ht 60.0 in | Wt 102.3 lb

## 2013-09-08 DIAGNOSIS — I10 Essential (primary) hypertension: Secondary | ICD-10-CM | POA: Diagnosis not present

## 2013-09-08 DIAGNOSIS — E785 Hyperlipidemia, unspecified: Secondary | ICD-10-CM | POA: Diagnosis not present

## 2013-09-08 NOTE — Patient Instructions (Signed)
Dr Berry wants you to follow-up in 1 year . You will receive a reminder letter in the mail two months in advance. If you don't receive a letter, please call our office to schedule the follow-up appointment. 

## 2013-09-08 NOTE — Progress Notes (Signed)
09/08/2013 Emily Jackson   Mar 27, 1923  [id number]  Primary Physician Gerrit Heck, MD Primary Cardiologist: Lorretta Harp MD [name]   HPI:  The patient is a very pleasant 80 year old thin-appearing married Caucasian female, mother of 2, grandmother to 1 grandchild, who I last saw 6 months ago. She has a history of hypertension, hyperlipidemia, and supraventricular tachycardia in the past. She had a negative Myoview, February 2010. She denies chest pain or shortness of breath. Dr. Drema Dallas follows her lipid profile closely which was last performed in October of last year related to pressure 160, LDL 82 HDL of 65    Current Outpatient Prescriptions  Medication Sig Dispense Refill  . acetaminophen-codeine (TYLENOL #3) 300-30 MG per tablet Take 1 tablet by mouth daily as needed.       Marland Kitchen amLODipine (NORVASC) 5 MG tablet Take 1 tablet (5 mg total) by mouth 2 (two) times daily.  60 tablet  3  . Ascorbic Acid (VITAMIN C PO) Take by mouth daily.      Marland Kitchen atenolol (TENORMIN) 50 MG tablet Take 25 mg by mouth daily. Take as directed      . atorvastatin (LIPITOR) 10 MG tablet Take 10 mg by mouth daily.      . AZOPT 1 % ophthalmic suspension Place 1 drop into both eyes 3 (three) times daily.       . bimatoprost (LUMIGAN) 0.01 % SOLN Place 1 drop into both eyes at bedtime.       . brimonidine-timolol (COMBIGAN) 0.2-0.5 % ophthalmic solution Place 1 drop into both eyes every 12 (twelve) hours.      . butalbital-aspirin-caffeine (FIORINAL) 50-325-40 MG per capsule Take 1 capsule by mouth as needed for headache.      Marland Kitchen CALCIUM PO Take 1,200 mg by mouth daily.      . cetirizine (ZYRTEC) 10 MG tablet Take 10 mg by mouth daily.      . fluticasone (FLONASE) 50 MCG/ACT nasal spray Place 2 sprays into the nose daily.      . furosemide (LASIX) 20 MG tablet Take 20 mg by mouth daily.       Marland Kitchen gabapentin (NEURONTIN) 600 MG tablet Take 300 mg by mouth at bedtime.      Marland Kitchen ipratropium  (ATROVENT) 0.03 % nasal spray Place 2 sprays into the nose every 12 (twelve) hours.      Marland Kitchen levothyroxine (SYNTHROID, LEVOTHROID) 50 MCG tablet Take 50 mcg by mouth daily before breakfast.      . lisinopril (PRINIVIL,ZESTRIL) 20 MG tablet Take 20 mg by mouth 2 (two) times daily.      Marland Kitchen VITAMIN E PO Take 1 tablet by mouth daily.       No current facility-administered medications for this visit.    Allergies  Allergen Reactions  . Sulfa Antibiotics Itching    History   Social History  . Marital Status: Married    Spouse Name: N/A    Number of Children: N/A  . Years of Education: N/A   Occupational History  . Not on file.   Social History Main Topics  . Smoking status: Never Smoker   . Smokeless tobacco: Not on file  . Alcohol Use: 2.5 oz/week    5 drink(s) per week     Comment: 4-5 glasses of alcohol or wine a week  . Drug Use: No  . Sexual Activity: No   Other Topics Concern  . Not on file   Social History Narrative  .  No narrative on file     Review of Systems: General: negative for chills, fever, night sweats or weight changes.  Cardiovascular: negative for chest pain, dyspnea on exertion, edema, orthopnea, palpitations, paroxysmal nocturnal dyspnea or shortness of breath Dermatological: negative for rash Respiratory: negative for cough or wheezing Urologic: negative for hematuria Abdominal: negative for nausea, vomiting, diarrhea, bright red blood per rectum, melena, or hematemesis Neurologic: negative for visual changes, syncope, or dizziness All other systems reviewed and are otherwise negative except as noted above.    Blood pressure 140/72, height 5' (1.524 m), weight 102 lb 4.8 oz (46.403 kg).  General appearance: alert and no distress Neck: no adenopathy, no carotid bruit, no JVD, supple, symmetrical, trachea midline and thyroid not enlarged, symmetric, no tenderness/mass/nodules Lungs: clear to auscultation bilaterally Heart: regular rate and rhythm,  S1, S2 normal, no murmur, click, rub or gallop Extremities: extremities normal, atraumatic, no cyanosis or edema  EKG normal sinus rhythm at 60 with nonspecific ST and T wave changes  ASSESSMENT AND PLAN:   Essential hypertension Well-controlled on current medications  Hyperlipidemia Therapy with recent lipid profile performed 04/01/13 revealing a total cholesterol of 160, LDL of 82 HDL Campbell MD [facility], [facility] 09/08/2013 4:06 PM

## 2013-09-08 NOTE — Assessment & Plan Note (Signed)
Well-controlled on current medications 

## 2013-09-08 NOTE — Assessment & Plan Note (Signed)
Therapy with recent lipid profile performed 04/01/13 revealing a total cholesterol of 160, LDL of 82 HDL 65

## 2013-09-14 DIAGNOSIS — M810 Age-related osteoporosis without current pathological fracture: Secondary | ICD-10-CM | POA: Diagnosis not present

## 2013-09-14 DIAGNOSIS — M5030 Other cervical disc degeneration, unspecified cervical region: Secondary | ICD-10-CM | POA: Diagnosis not present

## 2013-09-14 DIAGNOSIS — Z1331 Encounter for screening for depression: Secondary | ICD-10-CM | POA: Diagnosis not present

## 2013-09-14 DIAGNOSIS — I6529 Occlusion and stenosis of unspecified carotid artery: Secondary | ICD-10-CM | POA: Diagnosis not present

## 2013-09-14 DIAGNOSIS — Z79899 Other long term (current) drug therapy: Secondary | ICD-10-CM | POA: Diagnosis not present

## 2013-09-14 DIAGNOSIS — M5137 Other intervertebral disc degeneration, lumbosacral region: Secondary | ICD-10-CM | POA: Diagnosis not present

## 2013-09-14 DIAGNOSIS — M9981 Other biomechanical lesions of cervical region: Secondary | ICD-10-CM | POA: Diagnosis not present

## 2013-09-14 DIAGNOSIS — I10 Essential (primary) hypertension: Secondary | ICD-10-CM | POA: Diagnosis not present

## 2013-09-14 DIAGNOSIS — Z0000 Encounter for general adult medical examination without abnormal findings: Secondary | ICD-10-CM | POA: Diagnosis not present

## 2013-09-14 DIAGNOSIS — E039 Hypothyroidism, unspecified: Secondary | ICD-10-CM | POA: Diagnosis not present

## 2013-09-14 DIAGNOSIS — E785 Hyperlipidemia, unspecified: Secondary | ICD-10-CM | POA: Diagnosis not present

## 2013-09-14 DIAGNOSIS — M999 Biomechanical lesion, unspecified: Secondary | ICD-10-CM | POA: Diagnosis not present

## 2013-09-14 DIAGNOSIS — M461 Sacroiliitis, not elsewhere classified: Secondary | ICD-10-CM | POA: Diagnosis not present

## 2013-09-28 DIAGNOSIS — M461 Sacroiliitis, not elsewhere classified: Secondary | ICD-10-CM | POA: Diagnosis not present

## 2013-09-28 DIAGNOSIS — M5137 Other intervertebral disc degeneration, lumbosacral region: Secondary | ICD-10-CM | POA: Diagnosis not present

## 2013-09-28 DIAGNOSIS — M9981 Other biomechanical lesions of cervical region: Secondary | ICD-10-CM | POA: Diagnosis not present

## 2013-09-28 DIAGNOSIS — M5030 Other cervical disc degeneration, unspecified cervical region: Secondary | ICD-10-CM | POA: Diagnosis not present

## 2013-09-28 DIAGNOSIS — M999 Biomechanical lesion, unspecified: Secondary | ICD-10-CM | POA: Diagnosis not present

## 2013-09-30 ENCOUNTER — Other Ambulatory Visit: Payer: Self-pay | Admitting: *Deleted

## 2013-09-30 MED ORDER — AMLODIPINE BESYLATE 5 MG PO TABS: 5.0000 mg | ORAL_TABLET | Freq: Two times a day (BID) | ORAL | Status: DC

## 2013-09-30 NOTE — Telephone Encounter (Signed)
Rx refill sent to patient pharmacy   

## 2013-10-01 ENCOUNTER — Other Ambulatory Visit: Payer: Self-pay | Admitting: *Deleted

## 2013-10-06 ENCOUNTER — Other Ambulatory Visit: Payer: Self-pay

## 2013-10-06 MED ORDER — AMLODIPINE BESYLATE 5 MG PO TABS: 5.0000 mg | ORAL_TABLET | Freq: Two times a day (BID) | ORAL | Status: DC

## 2013-10-06 NOTE — Telephone Encounter (Signed)
Rx was sent to pharmacy electronically. 

## 2013-10-08 ENCOUNTER — Ambulatory Visit (INDEPENDENT_AMBULATORY_CARE_PROVIDER_SITE_OTHER): Payer: Medicare Other | Admitting: Gynecology

## 2013-10-08 ENCOUNTER — Ambulatory Visit: Payer: Medicare Other | Admitting: Obstetrics and Gynecology

## 2013-10-08 ENCOUNTER — Encounter: Payer: Self-pay | Admitting: Gynecology

## 2013-10-08 VITALS — BP 122/60 | HR 80 | Resp 18 | Ht 60.0 in | Wt 103.0 lb

## 2013-10-08 DIAGNOSIS — H409 Unspecified glaucoma: Secondary | ICD-10-CM | POA: Insufficient documentation

## 2013-10-08 DIAGNOSIS — M810 Age-related osteoporosis without current pathological fracture: Secondary | ICD-10-CM

## 2013-10-08 DIAGNOSIS — Z01419 Encounter for gynecological examination (general) (routine) without abnormal findings: Secondary | ICD-10-CM | POA: Diagnosis not present

## 2013-10-08 DIAGNOSIS — Z124 Encounter for screening for malignant neoplasm of cervix: Secondary | ICD-10-CM

## 2013-10-08 DIAGNOSIS — G43909 Migraine, unspecified, not intractable, without status migrainosus: Secondary | ICD-10-CM | POA: Insufficient documentation

## 2013-10-08 NOTE — Progress Notes (Signed)
80 y.o. Married Caucasian female   G2P2 here for annual exam. She does not report hot flashes, does not have night sweats, does have vaginal dryness.  She is not using lubricants.  She does not report post-menopasual bleeding.  Pt did not get DEXA last year.  She is taking neurontin nightly for migraines.    No LMP recorded. Patient has had a hysterectomy.          Sexually active: no  The current method of family planning is status post hysterectomy.    Exercising: yes  Walking Last pap: Patient can't remember  Abnormal PAP: No  Mammogram: 08/2013 BI-RADS 2: Benign  BSE: No Colonoscopy: 2012 DEXA: 2012 Alcohol: Yes Tobacco: No  PCP: Dr Leighton Ruff  Health Maintenance  Topic Date Due  . Tetanus/tdap  10/11/1943  . Zostavax  10/10/1984  . Pneumococcal Polysaccharide Vaccine Age 32 And Over  10/10/1989  . Influenza Vaccine  01/15/2014  . Colonoscopy  01/15/2021    Family History  Problem Relation Age of Onset  . Hypertension Mother   . Hypertension Father   . Heart disease Father     Patient Active Problem List   Diagnosis Date Noted  . Essential hypertension 09/08/2013  . Hyperlipidemia 09/08/2013    Past Medical History  Diagnosis Date  . Pneumonia 05/2012  . Glaucoma   . Hypertension   . Migraines   . Hyperlipemia   . Bronchitis 06/2013    Past Surgical History  Procedure Laterality Date  . Appendectomy    . Vaginal hysterectomy      BSO,A&P repair    Allergies: Sulfa antibiotics  Current Outpatient Prescriptions  Medication Sig Dispense Refill  . acetaminophen-codeine (TYLENOL #3) 300-30 MG per tablet Take 1 tablet by mouth daily as needed.       Marland Kitchen amLODipine (NORVASC) 5 MG tablet Take 1 tablet (5 mg total) by mouth 2 (two) times daily.  60 tablet  11  . Ascorbic Acid (VITAMIN C PO) Take by mouth daily.      Marland Kitchen atenolol (TENORMIN) 50 MG tablet Take 25 mg by mouth daily. Take as directed      . atorvastatin (LIPITOR) 10 MG tablet Take 10 mg by  mouth daily.      . AZOPT 1 % ophthalmic suspension Place 1 drop into both eyes 3 (three) times daily.       . bimatoprost (LUMIGAN) 0.01 % SOLN Place 1 drop into both eyes at bedtime.       . brimonidine-timolol (COMBIGAN) 0.2-0.5 % ophthalmic solution Place 1 drop into both eyes every 12 (twelve) hours.      . butalbital-aspirin-caffeine (FIORINAL) 50-325-40 MG per capsule Take 1 capsule by mouth as needed for headache.      Marland Kitchen CALCIUM PO Take 1,200 mg by mouth daily.      . cetirizine (ZYRTEC) 10 MG tablet Take 10 mg by mouth daily.      . fluticasone (FLONASE) 50 MCG/ACT nasal spray Place 2 sprays into the nose daily.      . furosemide (LASIX) 20 MG tablet Take 20 mg by mouth daily.       Marland Kitchen gabapentin (NEURONTIN) 600 MG tablet Take 300 mg by mouth at bedtime.      Marland Kitchen ipratropium (ATROVENT) 0.03 % nasal spray Place 2 sprays into the nose every 12 (twelve) hours.      Marland Kitchen levothyroxine (SYNTHROID, LEVOTHROID) 50 MCG tablet Take 50 mcg by mouth daily before breakfast.      .  lisinopril (PRINIVIL,ZESTRIL) 20 MG tablet Take 20 mg by mouth 2 (two) times daily.      Marland Kitchen VITAMIN E PO Take 1 tablet by mouth daily.       No current facility-administered medications for this visit.    ROS: Pertinent items are noted in HPI.  Exam:    BP 122/60  Pulse 80  Resp 18  Ht 5' (1.524 m)  Wt 103 lb (46.72 kg)  BMI 20.12 kg/m2 Weight change: @WEIGHTCHANGE @ Last 3 height recordings:  Ht Readings from Last 3 Encounters:  10/08/13 5' (1.524 m)  09/08/13 5' (1.524 m)  09/25/12 5' 0.25" (1.53 m)   General appearance: alert, cooperative and appears stated age Head: Normocephalic, without obvious abnormality, atraumatic Neck: no adenopathy, no carotid bruit, no JVD, supple, symmetrical, trachea midline and thyroid not enlarged, symmetric, no tenderness/mass/nodules Lungs: clear to auscultation bilaterally Breasts: normal appearance, no masses or tenderness Heart: regular rate and rhythm, S1, S2 normal, no  murmur, click, rub or gallop Abdomen: soft, non-tender; bowel sounds normal; no masses,  no organomegaly Extremities: extremities normal, atraumatic, no cyanosis or edema Skin: Skin color, texture, turgor normal. No rashes or lesions Lymph nodes: Cervical, supraclavicular, and axillary nodes normal. no inguinal nodes palpated Neurologic: Grossly normal   Pelvic: External genitalia:  no lesions              Urethra: normal appearing urethra with no masses, tenderness or lesions              Bartholins and Skenes: Bartholin's, Urethra, Skene's normal                 Vagina: atrophic              Cervix: absent              Pap taken: no        Bimanual Exam:  Uterus:  absent                                      Adnexa:    no masses                                      Rectovaginal: Confirms                                      Anus:  normal sphincter tone, no lesions  A: well woman Osteoporosis Vaginal atrophy     P: mammogram pap smear not indicated Pt not interested in repeating DEXA after 9y on actonel, will do vit D and calcium counseled on breast self exam, mammography screening, adequate intake of calcium and vitamin D, diet and exercise return annually or prn Discussed PAP guideline changes, importance of weight bearing exercises, calcium, vit D and balanced diet.  An After Visit Summary was printed and given to the patient.

## 2013-10-08 NOTE — Patient Instructions (Signed)

## 2013-10-12 DIAGNOSIS — M461 Sacroiliitis, not elsewhere classified: Secondary | ICD-10-CM | POA: Diagnosis not present

## 2013-10-12 DIAGNOSIS — M5030 Other cervical disc degeneration, unspecified cervical region: Secondary | ICD-10-CM | POA: Diagnosis not present

## 2013-10-12 DIAGNOSIS — M9981 Other biomechanical lesions of cervical region: Secondary | ICD-10-CM | POA: Diagnosis not present

## 2013-10-12 DIAGNOSIS — M999 Biomechanical lesion, unspecified: Secondary | ICD-10-CM | POA: Diagnosis not present

## 2013-10-12 DIAGNOSIS — M5137 Other intervertebral disc degeneration, lumbosacral region: Secondary | ICD-10-CM | POA: Diagnosis not present

## 2013-10-20 DIAGNOSIS — H4011X0 Primary open-angle glaucoma, stage unspecified: Secondary | ICD-10-CM | POA: Diagnosis not present

## 2013-10-20 DIAGNOSIS — Z961 Presence of intraocular lens: Secondary | ICD-10-CM | POA: Diagnosis not present

## 2013-10-26 DIAGNOSIS — M5137 Other intervertebral disc degeneration, lumbosacral region: Secondary | ICD-10-CM | POA: Diagnosis not present

## 2013-10-26 DIAGNOSIS — M9981 Other biomechanical lesions of cervical region: Secondary | ICD-10-CM | POA: Diagnosis not present

## 2013-10-26 DIAGNOSIS — M5030 Other cervical disc degeneration, unspecified cervical region: Secondary | ICD-10-CM | POA: Diagnosis not present

## 2013-10-26 DIAGNOSIS — M999 Biomechanical lesion, unspecified: Secondary | ICD-10-CM | POA: Diagnosis not present

## 2013-10-26 DIAGNOSIS — M461 Sacroiliitis, not elsewhere classified: Secondary | ICD-10-CM | POA: Diagnosis not present

## 2013-11-16 DIAGNOSIS — M999 Biomechanical lesion, unspecified: Secondary | ICD-10-CM | POA: Diagnosis not present

## 2013-11-16 DIAGNOSIS — M461 Sacroiliitis, not elsewhere classified: Secondary | ICD-10-CM | POA: Diagnosis not present

## 2013-11-16 DIAGNOSIS — M5030 Other cervical disc degeneration, unspecified cervical region: Secondary | ICD-10-CM | POA: Diagnosis not present

## 2013-11-16 DIAGNOSIS — M5137 Other intervertebral disc degeneration, lumbosacral region: Secondary | ICD-10-CM | POA: Diagnosis not present

## 2013-11-16 DIAGNOSIS — M9981 Other biomechanical lesions of cervical region: Secondary | ICD-10-CM | POA: Diagnosis not present

## 2013-11-29 ENCOUNTER — Other Ambulatory Visit: Payer: Self-pay | Admitting: Cardiovascular Disease

## 2013-11-29 NOTE — Telephone Encounter (Signed)
Rx was sent to pharmacy electronically. 

## 2013-11-30 DIAGNOSIS — M9981 Other biomechanical lesions of cervical region: Secondary | ICD-10-CM | POA: Diagnosis not present

## 2013-11-30 DIAGNOSIS — M5137 Other intervertebral disc degeneration, lumbosacral region: Secondary | ICD-10-CM | POA: Diagnosis not present

## 2013-11-30 DIAGNOSIS — M999 Biomechanical lesion, unspecified: Secondary | ICD-10-CM | POA: Diagnosis not present

## 2013-11-30 DIAGNOSIS — M461 Sacroiliitis, not elsewhere classified: Secondary | ICD-10-CM | POA: Diagnosis not present

## 2013-11-30 DIAGNOSIS — M5030 Other cervical disc degeneration, unspecified cervical region: Secondary | ICD-10-CM | POA: Diagnosis not present

## 2013-12-21 DIAGNOSIS — M5137 Other intervertebral disc degeneration, lumbosacral region: Secondary | ICD-10-CM | POA: Diagnosis not present

## 2013-12-21 DIAGNOSIS — M461 Sacroiliitis, not elsewhere classified: Secondary | ICD-10-CM | POA: Diagnosis not present

## 2013-12-21 DIAGNOSIS — M5030 Other cervical disc degeneration, unspecified cervical region: Secondary | ICD-10-CM | POA: Diagnosis not present

## 2013-12-21 DIAGNOSIS — M999 Biomechanical lesion, unspecified: Secondary | ICD-10-CM | POA: Diagnosis not present

## 2013-12-21 DIAGNOSIS — M9981 Other biomechanical lesions of cervical region: Secondary | ICD-10-CM | POA: Diagnosis not present

## 2014-01-06 ENCOUNTER — Other Ambulatory Visit: Payer: Self-pay

## 2014-01-06 MED ORDER — ATENOLOL 50 MG PO TABS
25.0000 mg | ORAL_TABLET | Freq: Every day | ORAL | Status: DC
Start: 2014-01-06 — End: 2014-09-12

## 2014-01-06 NOTE — Telephone Encounter (Signed)
Rx was sent to pharmacy electronically. 

## 2014-01-19 DIAGNOSIS — J40 Bronchitis, not specified as acute or chronic: Secondary | ICD-10-CM | POA: Diagnosis not present

## 2014-02-02 DIAGNOSIS — M461 Sacroiliitis, not elsewhere classified: Secondary | ICD-10-CM | POA: Diagnosis not present

## 2014-02-02 DIAGNOSIS — M9981 Other biomechanical lesions of cervical region: Secondary | ICD-10-CM | POA: Diagnosis not present

## 2014-02-02 DIAGNOSIS — M5137 Other intervertebral disc degeneration, lumbosacral region: Secondary | ICD-10-CM | POA: Diagnosis not present

## 2014-02-02 DIAGNOSIS — M999 Biomechanical lesion, unspecified: Secondary | ICD-10-CM | POA: Diagnosis not present

## 2014-02-02 DIAGNOSIS — M5030 Other cervical disc degeneration, unspecified cervical region: Secondary | ICD-10-CM | POA: Diagnosis not present

## 2014-02-22 DIAGNOSIS — Z961 Presence of intraocular lens: Secondary | ICD-10-CM | POA: Diagnosis not present

## 2014-02-22 DIAGNOSIS — H4011X0 Primary open-angle glaucoma, stage unspecified: Secondary | ICD-10-CM | POA: Diagnosis not present

## 2014-02-23 DIAGNOSIS — M9981 Other biomechanical lesions of cervical region: Secondary | ICD-10-CM | POA: Diagnosis not present

## 2014-02-23 DIAGNOSIS — M5137 Other intervertebral disc degeneration, lumbosacral region: Secondary | ICD-10-CM | POA: Diagnosis not present

## 2014-02-23 DIAGNOSIS — M999 Biomechanical lesion, unspecified: Secondary | ICD-10-CM | POA: Diagnosis not present

## 2014-02-23 DIAGNOSIS — M5030 Other cervical disc degeneration, unspecified cervical region: Secondary | ICD-10-CM | POA: Diagnosis not present

## 2014-02-23 DIAGNOSIS — M461 Sacroiliitis, not elsewhere classified: Secondary | ICD-10-CM | POA: Diagnosis not present

## 2014-03-09 DIAGNOSIS — M9981 Other biomechanical lesions of cervical region: Secondary | ICD-10-CM | POA: Diagnosis not present

## 2014-03-09 DIAGNOSIS — M999 Biomechanical lesion, unspecified: Secondary | ICD-10-CM | POA: Diagnosis not present

## 2014-03-09 DIAGNOSIS — M5137 Other intervertebral disc degeneration, lumbosacral region: Secondary | ICD-10-CM | POA: Diagnosis not present

## 2014-03-09 DIAGNOSIS — M5030 Other cervical disc degeneration, unspecified cervical region: Secondary | ICD-10-CM | POA: Diagnosis not present

## 2014-03-09 DIAGNOSIS — M461 Sacroiliitis, not elsewhere classified: Secondary | ICD-10-CM | POA: Diagnosis not present

## 2014-03-23 DIAGNOSIS — M5032 Other cervical disc degeneration, mid-cervical region: Secondary | ICD-10-CM | POA: Diagnosis not present

## 2014-03-23 DIAGNOSIS — M9902 Segmental and somatic dysfunction of thoracic region: Secondary | ICD-10-CM | POA: Diagnosis not present

## 2014-03-23 DIAGNOSIS — M9901 Segmental and somatic dysfunction of cervical region: Secondary | ICD-10-CM | POA: Diagnosis not present

## 2014-03-23 DIAGNOSIS — M9903 Segmental and somatic dysfunction of lumbar region: Secondary | ICD-10-CM | POA: Diagnosis not present

## 2014-03-23 DIAGNOSIS — M545 Low back pain: Secondary | ICD-10-CM | POA: Diagnosis not present

## 2014-03-23 DIAGNOSIS — M546 Pain in thoracic spine: Secondary | ICD-10-CM | POA: Diagnosis not present

## 2014-03-29 DIAGNOSIS — M9901 Segmental and somatic dysfunction of cervical region: Secondary | ICD-10-CM | POA: Diagnosis not present

## 2014-03-29 DIAGNOSIS — M9902 Segmental and somatic dysfunction of thoracic region: Secondary | ICD-10-CM | POA: Diagnosis not present

## 2014-03-29 DIAGNOSIS — Z23 Encounter for immunization: Secondary | ICD-10-CM | POA: Diagnosis not present

## 2014-03-29 DIAGNOSIS — L03032 Cellulitis of left toe: Secondary | ICD-10-CM | POA: Diagnosis not present

## 2014-03-29 DIAGNOSIS — M5032 Other cervical disc degeneration, mid-cervical region: Secondary | ICD-10-CM | POA: Diagnosis not present

## 2014-03-29 DIAGNOSIS — M546 Pain in thoracic spine: Secondary | ICD-10-CM | POA: Diagnosis not present

## 2014-03-29 DIAGNOSIS — M9903 Segmental and somatic dysfunction of lumbar region: Secondary | ICD-10-CM | POA: Diagnosis not present

## 2014-03-29 DIAGNOSIS — M545 Low back pain: Secondary | ICD-10-CM | POA: Diagnosis not present

## 2014-04-13 DIAGNOSIS — M545 Low back pain: Secondary | ICD-10-CM | POA: Diagnosis not present

## 2014-04-13 DIAGNOSIS — M9903 Segmental and somatic dysfunction of lumbar region: Secondary | ICD-10-CM | POA: Diagnosis not present

## 2014-04-13 DIAGNOSIS — M9902 Segmental and somatic dysfunction of thoracic region: Secondary | ICD-10-CM | POA: Diagnosis not present

## 2014-04-13 DIAGNOSIS — M9901 Segmental and somatic dysfunction of cervical region: Secondary | ICD-10-CM | POA: Diagnosis not present

## 2014-04-13 DIAGNOSIS — M546 Pain in thoracic spine: Secondary | ICD-10-CM | POA: Diagnosis not present

## 2014-04-13 DIAGNOSIS — M5032 Other cervical disc degeneration, mid-cervical region: Secondary | ICD-10-CM | POA: Diagnosis not present

## 2014-04-18 ENCOUNTER — Encounter: Payer: Self-pay | Admitting: Gynecology

## 2014-04-27 DIAGNOSIS — M9902 Segmental and somatic dysfunction of thoracic region: Secondary | ICD-10-CM | POA: Diagnosis not present

## 2014-04-27 DIAGNOSIS — M546 Pain in thoracic spine: Secondary | ICD-10-CM | POA: Diagnosis not present

## 2014-04-27 DIAGNOSIS — M9901 Segmental and somatic dysfunction of cervical region: Secondary | ICD-10-CM | POA: Diagnosis not present

## 2014-04-27 DIAGNOSIS — M9903 Segmental and somatic dysfunction of lumbar region: Secondary | ICD-10-CM | POA: Diagnosis not present

## 2014-04-27 DIAGNOSIS — M545 Low back pain: Secondary | ICD-10-CM | POA: Diagnosis not present

## 2014-04-27 DIAGNOSIS — M5032 Other cervical disc degeneration, mid-cervical region: Secondary | ICD-10-CM | POA: Diagnosis not present

## 2014-05-11 DIAGNOSIS — M546 Pain in thoracic spine: Secondary | ICD-10-CM | POA: Diagnosis not present

## 2014-05-11 DIAGNOSIS — M545 Low back pain: Secondary | ICD-10-CM | POA: Diagnosis not present

## 2014-05-11 DIAGNOSIS — M9903 Segmental and somatic dysfunction of lumbar region: Secondary | ICD-10-CM | POA: Diagnosis not present

## 2014-05-11 DIAGNOSIS — M5032 Other cervical disc degeneration, mid-cervical region: Secondary | ICD-10-CM | POA: Diagnosis not present

## 2014-05-11 DIAGNOSIS — M9901 Segmental and somatic dysfunction of cervical region: Secondary | ICD-10-CM | POA: Diagnosis not present

## 2014-05-11 DIAGNOSIS — M9902 Segmental and somatic dysfunction of thoracic region: Secondary | ICD-10-CM | POA: Diagnosis not present

## 2014-05-17 ENCOUNTER — Telehealth: Payer: Self-pay

## 2014-05-17 NOTE — Telephone Encounter (Signed)
AEX has been rescheduled for 10/10/14 with Kem Boroughs

## 2014-05-18 DIAGNOSIS — M545 Low back pain: Secondary | ICD-10-CM | POA: Diagnosis not present

## 2014-05-18 DIAGNOSIS — M9901 Segmental and somatic dysfunction of cervical region: Secondary | ICD-10-CM | POA: Diagnosis not present

## 2014-05-18 DIAGNOSIS — M5032 Other cervical disc degeneration, mid-cervical region: Secondary | ICD-10-CM | POA: Diagnosis not present

## 2014-05-18 DIAGNOSIS — M9902 Segmental and somatic dysfunction of thoracic region: Secondary | ICD-10-CM | POA: Diagnosis not present

## 2014-05-18 DIAGNOSIS — M9903 Segmental and somatic dysfunction of lumbar region: Secondary | ICD-10-CM | POA: Diagnosis not present

## 2014-05-18 DIAGNOSIS — M546 Pain in thoracic spine: Secondary | ICD-10-CM | POA: Diagnosis not present

## 2014-05-25 DIAGNOSIS — M546 Pain in thoracic spine: Secondary | ICD-10-CM | POA: Diagnosis not present

## 2014-05-25 DIAGNOSIS — M9901 Segmental and somatic dysfunction of cervical region: Secondary | ICD-10-CM | POA: Diagnosis not present

## 2014-05-25 DIAGNOSIS — M9902 Segmental and somatic dysfunction of thoracic region: Secondary | ICD-10-CM | POA: Diagnosis not present

## 2014-05-25 DIAGNOSIS — M9903 Segmental and somatic dysfunction of lumbar region: Secondary | ICD-10-CM | POA: Diagnosis not present

## 2014-05-25 DIAGNOSIS — M5032 Other cervical disc degeneration, mid-cervical region: Secondary | ICD-10-CM | POA: Diagnosis not present

## 2014-05-25 DIAGNOSIS — M545 Low back pain: Secondary | ICD-10-CM | POA: Diagnosis not present

## 2014-06-01 DIAGNOSIS — M546 Pain in thoracic spine: Secondary | ICD-10-CM | POA: Diagnosis not present

## 2014-06-01 DIAGNOSIS — M5032 Other cervical disc degeneration, mid-cervical region: Secondary | ICD-10-CM | POA: Diagnosis not present

## 2014-06-01 DIAGNOSIS — M545 Low back pain: Secondary | ICD-10-CM | POA: Diagnosis not present

## 2014-06-01 DIAGNOSIS — M9902 Segmental and somatic dysfunction of thoracic region: Secondary | ICD-10-CM | POA: Diagnosis not present

## 2014-06-01 DIAGNOSIS — M9903 Segmental and somatic dysfunction of lumbar region: Secondary | ICD-10-CM | POA: Diagnosis not present

## 2014-06-01 DIAGNOSIS — M9901 Segmental and somatic dysfunction of cervical region: Secondary | ICD-10-CM | POA: Diagnosis not present

## 2014-06-24 DIAGNOSIS — H4011X3 Primary open-angle glaucoma, severe stage: Secondary | ICD-10-CM | POA: Diagnosis not present

## 2014-06-24 DIAGNOSIS — H4011X2 Primary open-angle glaucoma, moderate stage: Secondary | ICD-10-CM | POA: Diagnosis not present

## 2014-07-05 ENCOUNTER — Telehealth: Payer: Self-pay | Admitting: Cardiovascular Disease

## 2014-07-05 NOTE — Telephone Encounter (Signed)
Closed encounter °

## 2014-07-07 DIAGNOSIS — M9903 Segmental and somatic dysfunction of lumbar region: Secondary | ICD-10-CM | POA: Diagnosis not present

## 2014-07-07 DIAGNOSIS — M5032 Other cervical disc degeneration, mid-cervical region: Secondary | ICD-10-CM | POA: Diagnosis not present

## 2014-07-07 DIAGNOSIS — M9901 Segmental and somatic dysfunction of cervical region: Secondary | ICD-10-CM | POA: Diagnosis not present

## 2014-07-07 DIAGNOSIS — J069 Acute upper respiratory infection, unspecified: Secondary | ICD-10-CM | POA: Diagnosis not present

## 2014-07-07 DIAGNOSIS — M545 Low back pain: Secondary | ICD-10-CM | POA: Diagnosis not present

## 2014-07-07 DIAGNOSIS — M546 Pain in thoracic spine: Secondary | ICD-10-CM | POA: Diagnosis not present

## 2014-07-07 DIAGNOSIS — M9902 Segmental and somatic dysfunction of thoracic region: Secondary | ICD-10-CM | POA: Diagnosis not present

## 2014-07-20 DIAGNOSIS — M9903 Segmental and somatic dysfunction of lumbar region: Secondary | ICD-10-CM | POA: Diagnosis not present

## 2014-07-20 DIAGNOSIS — M5032 Other cervical disc degeneration, mid-cervical region: Secondary | ICD-10-CM | POA: Diagnosis not present

## 2014-07-20 DIAGNOSIS — M9902 Segmental and somatic dysfunction of thoracic region: Secondary | ICD-10-CM | POA: Diagnosis not present

## 2014-07-20 DIAGNOSIS — M545 Low back pain: Secondary | ICD-10-CM | POA: Diagnosis not present

## 2014-07-20 DIAGNOSIS — M9901 Segmental and somatic dysfunction of cervical region: Secondary | ICD-10-CM | POA: Diagnosis not present

## 2014-07-20 DIAGNOSIS — M546 Pain in thoracic spine: Secondary | ICD-10-CM | POA: Diagnosis not present

## 2014-07-27 DIAGNOSIS — L821 Other seborrheic keratosis: Secondary | ICD-10-CM | POA: Diagnosis not present

## 2014-07-27 DIAGNOSIS — D239 Other benign neoplasm of skin, unspecified: Secondary | ICD-10-CM | POA: Diagnosis not present

## 2014-08-03 DIAGNOSIS — M546 Pain in thoracic spine: Secondary | ICD-10-CM | POA: Diagnosis not present

## 2014-08-03 DIAGNOSIS — M9901 Segmental and somatic dysfunction of cervical region: Secondary | ICD-10-CM | POA: Diagnosis not present

## 2014-08-03 DIAGNOSIS — M9902 Segmental and somatic dysfunction of thoracic region: Secondary | ICD-10-CM | POA: Diagnosis not present

## 2014-08-03 DIAGNOSIS — M5032 Other cervical disc degeneration, mid-cervical region: Secondary | ICD-10-CM | POA: Diagnosis not present

## 2014-08-03 DIAGNOSIS — M9903 Segmental and somatic dysfunction of lumbar region: Secondary | ICD-10-CM | POA: Diagnosis not present

## 2014-08-03 DIAGNOSIS — M545 Low back pain: Secondary | ICD-10-CM | POA: Diagnosis not present

## 2014-08-17 DIAGNOSIS — E785 Hyperlipidemia, unspecified: Secondary | ICD-10-CM | POA: Diagnosis not present

## 2014-08-17 DIAGNOSIS — R51 Headache: Secondary | ICD-10-CM | POA: Diagnosis not present

## 2014-08-17 DIAGNOSIS — E039 Hypothyroidism, unspecified: Secondary | ICD-10-CM | POA: Diagnosis not present

## 2014-08-17 DIAGNOSIS — M810 Age-related osteoporosis without current pathological fracture: Secondary | ICD-10-CM | POA: Diagnosis not present

## 2014-08-17 DIAGNOSIS — M1990 Unspecified osteoarthritis, unspecified site: Secondary | ICD-10-CM | POA: Diagnosis not present

## 2014-08-17 DIAGNOSIS — I10 Essential (primary) hypertension: Secondary | ICD-10-CM | POA: Diagnosis not present

## 2014-08-17 DIAGNOSIS — Z79899 Other long term (current) drug therapy: Secondary | ICD-10-CM | POA: Diagnosis not present

## 2014-08-18 DIAGNOSIS — M9902 Segmental and somatic dysfunction of thoracic region: Secondary | ICD-10-CM | POA: Diagnosis not present

## 2014-08-18 DIAGNOSIS — M5032 Other cervical disc degeneration, mid-cervical region: Secondary | ICD-10-CM | POA: Diagnosis not present

## 2014-08-18 DIAGNOSIS — M9901 Segmental and somatic dysfunction of cervical region: Secondary | ICD-10-CM | POA: Diagnosis not present

## 2014-08-18 DIAGNOSIS — M546 Pain in thoracic spine: Secondary | ICD-10-CM | POA: Diagnosis not present

## 2014-08-18 DIAGNOSIS — M9903 Segmental and somatic dysfunction of lumbar region: Secondary | ICD-10-CM | POA: Diagnosis not present

## 2014-08-18 DIAGNOSIS — M545 Low back pain: Secondary | ICD-10-CM | POA: Diagnosis not present

## 2014-08-24 DIAGNOSIS — Z1231 Encounter for screening mammogram for malignant neoplasm of breast: Secondary | ICD-10-CM | POA: Diagnosis not present

## 2014-08-26 ENCOUNTER — Other Ambulatory Visit: Payer: Self-pay | Admitting: Cardiovascular Disease

## 2014-08-26 NOTE — Telephone Encounter (Signed)
Rx has been sent to the pharmacy electronically. ° °

## 2014-08-31 DIAGNOSIS — M9902 Segmental and somatic dysfunction of thoracic region: Secondary | ICD-10-CM | POA: Diagnosis not present

## 2014-08-31 DIAGNOSIS — M5032 Other cervical disc degeneration, mid-cervical region: Secondary | ICD-10-CM | POA: Diagnosis not present

## 2014-08-31 DIAGNOSIS — M546 Pain in thoracic spine: Secondary | ICD-10-CM | POA: Diagnosis not present

## 2014-08-31 DIAGNOSIS — M9903 Segmental and somatic dysfunction of lumbar region: Secondary | ICD-10-CM | POA: Diagnosis not present

## 2014-08-31 DIAGNOSIS — M545 Low back pain: Secondary | ICD-10-CM | POA: Diagnosis not present

## 2014-08-31 DIAGNOSIS — M9901 Segmental and somatic dysfunction of cervical region: Secondary | ICD-10-CM | POA: Diagnosis not present

## 2014-09-12 ENCOUNTER — Ambulatory Visit (INDEPENDENT_AMBULATORY_CARE_PROVIDER_SITE_OTHER): Payer: Medicare Other | Admitting: Cardiovascular Disease

## 2014-09-12 ENCOUNTER — Ambulatory Visit: Payer: Medicare Other | Admitting: Cardiovascular Disease

## 2014-09-12 ENCOUNTER — Encounter: Payer: Self-pay | Admitting: Cardiovascular Disease

## 2014-09-12 VITALS — BP 120/74 | HR 55 | Ht 60.0 in | Wt 96.9 lb

## 2014-09-12 DIAGNOSIS — E785 Hyperlipidemia, unspecified: Secondary | ICD-10-CM

## 2014-09-12 DIAGNOSIS — I10 Essential (primary) hypertension: Secondary | ICD-10-CM

## 2014-09-12 MED ORDER — ATENOLOL 50 MG PO TABS: 25.0000 mg | ORAL_TABLET | Freq: Every day | ORAL | Status: AC

## 2014-09-12 MED ORDER — LISINOPRIL 20 MG PO TABS
20.0000 mg | ORAL_TABLET | Freq: Two times a day (BID) | ORAL | Status: AC
Start: 2014-09-12 — End: ?

## 2014-09-12 MED ORDER — AMLODIPINE BESYLATE 5 MG PO TABS: 5.0000 mg | ORAL_TABLET | Freq: Two times a day (BID) | ORAL | Status: DC

## 2014-09-12 NOTE — Assessment & Plan Note (Signed)
History of hyperlipidemia on atorvastatin 10 mg a day followed by her PCP

## 2014-09-12 NOTE — Assessment & Plan Note (Signed)
History of hypertension blood pressure measured at 120/74. She is on amlodipine 5 mg a day in addition to atenolol 50 mg a day and lisinopril 20 mg a day. Continued current meds at current dosing

## 2014-09-12 NOTE — Progress Notes (Signed)
09/12/2014 Emily Jackson   03-02-1923  [id number]  Primary Physician Gerrit Heck, MD Primary Cardiologist: Lorretta Harp MD [name]    HPI: The patient is a very pleasant 81 year old thin-appearing married Caucasian female, mother of 2, grandmother to 1 grandchild, who I last saw 12 months ago. She has a history of hypertension, hyperlipidemia, and supraventricular tachycardia in the past. She had a negative Myoview, February 2010. She denies chest pain or shortness of breath. Dr. Drema Dallas follows her lipid profile closely which was last performed apparently was recently performed.  Current Outpatient Prescriptions  Medication Sig Dispense Refill  . acetaminophen-codeine (TYLENOL #3) 300-30 MG per tablet Take 1 tablet by mouth daily as needed.     Marland Kitchen amLODipine (NORVASC) 5 MG tablet Take 1 tablet (5 mg total) by mouth 2 (two) times daily. 180 tablet 3  . Ascorbic Acid (VITAMIN C PO) Take by mouth daily.    Marland Kitchen atenolol (TENORMIN) 50 MG tablet Take 0.5 tablets (25 mg total) by mouth daily. Take as directed 45 tablet 3  . atorvastatin (LIPITOR) 10 MG tablet Take 10 mg by mouth daily.    . AZOPT 1 % ophthalmic suspension Place 1 drop into both eyes 2 (two) times daily.     . bimatoprost (LUMIGAN) 0.01 % SOLN Place 1 drop into both eyes at bedtime.     . brimonidine-timolol (COMBIGAN) 0.2-0.5 % ophthalmic solution Place 1 drop into both eyes every 12 (twelve) hours.    . butalbital-aspirin-caffeine (FIORINAL) 50-325-40 MG per capsule Take 1 capsule by mouth as needed for headache.    Marland Kitchen CALCIUM PO Take 1,200 mg by mouth daily.    . cetirizine (ZYRTEC) 10 MG tablet Take 10 mg by mouth daily.    . fluticasone (FLONASE) 50 MCG/ACT nasal spray Place 2 sprays into the nose daily.    . furosemide (LASIX) 20 MG tablet Take 20 mg by mouth daily.     Marland Kitchen gabapentin (NEURONTIN) 600 MG tablet Take 300 mg by mouth at bedtime.    Marland Kitchen ipratropium (ATROVENT) 0.03 % nasal spray  Place 2 sprays into the nose every 12 (twelve) hours.    Marland Kitchen levothyroxine (SYNTHROID, LEVOTHROID) 50 MCG tablet Take 50 mcg by mouth daily before breakfast.    . lisinopril (PRINIVIL,ZESTRIL) 20 MG tablet Take 1 tablet (20 mg total) by mouth 2 (two) times daily. 180 tablet 3  . VITAMIN E PO Take 1 tablet by mouth daily.     No current facility-administered medications for this visit.    Allergies  Allergen Reactions  . Sulfa Antibiotics Itching    History   Social History  . Marital Status: Married    Spouse Name: N/A  . Number of Children: N/A  . Years of Education: N/A   Occupational History  . Not on file.   Social History Main Topics  . Smoking status: Never Smoker   . Smokeless tobacco: Never Used  . Alcohol Use: 3.0 oz/week    5 Glasses of wine per week     Comment: 4-5 glasses of alcohol or wine a week  . Drug Use: No  . Sexual Activity: No   Other Topics Concern  . Not on file   Social History Narrative     Review of Systems: General: negative for chills, fever, night sweats or weight changes.  Cardiovascular: negative for chest pain, dyspnea on exertion, edema, orthopnea, palpitations, paroxysmal nocturnal dyspnea or shortness of breath Dermatological: negative for rash Respiratory:  negative for cough or wheezing Urologic: negative for hematuria Abdominal: negative for nausea, vomiting, diarrhea, bright red blood per rectum, melena, or hematemesis Neurologic: negative for visual changes, syncope, or dizziness All other systems reviewed and are otherwise negative except as noted above.    Blood pressure 120/74, pulse 55, height 5' (1.524 m), weight 96 lb 14.4 oz (43.954 kg).  General appearance: alert and no distress Neck: no adenopathy, no carotid bruit, no JVD, supple, symmetrical, trachea midline and thyroid not enlarged, symmetric, no tenderness/mass/nodules Lungs: clear to auscultation bilaterally Heart: regular rate and rhythm, S1, S2 normal, no  murmur, click, rub or gallop Extremities: extremities normal, atraumatic, no cyanosis or edema  EKG sinus bradycardia at 55 with nonspecific ST and T-wave changes and evidence of LVH. I personally reviewed this EKG  ASSESSMENT AND PLAN:   Hyperlipidemia History of hyperlipidemia on atorvastatin 10 mg a day followed by her PCP   Essential hypertension History of hypertension blood pressure measured at 120/74. She is on amlodipine 5 mg a day in addition to atenolol 50 mg a day and lisinopril 20 mg a day. Continued current meds at current dosing       Lorretta Harp MD [facility], [facility] 09/12/2014 3:28 PM

## 2014-09-12 NOTE — Patient Instructions (Signed)
Dr.Berry wants you to follow-up in: ONE YEAR. You will receive a reminder letter in the mail two months in advance. If you don't receive a letter, please call our office to schedule the follow-up appointment.

## 2014-09-13 ENCOUNTER — Encounter: Payer: Self-pay | Admitting: Cardiovascular Disease

## 2014-09-14 DIAGNOSIS — M9903 Segmental and somatic dysfunction of lumbar region: Secondary | ICD-10-CM | POA: Diagnosis not present

## 2014-09-14 DIAGNOSIS — M9901 Segmental and somatic dysfunction of cervical region: Secondary | ICD-10-CM | POA: Diagnosis not present

## 2014-09-14 DIAGNOSIS — M9902 Segmental and somatic dysfunction of thoracic region: Secondary | ICD-10-CM | POA: Diagnosis not present

## 2014-09-14 DIAGNOSIS — M5032 Other cervical disc degeneration, mid-cervical region: Secondary | ICD-10-CM | POA: Diagnosis not present

## 2014-09-14 DIAGNOSIS — M545 Low back pain: Secondary | ICD-10-CM | POA: Diagnosis not present

## 2014-09-14 DIAGNOSIS — M546 Pain in thoracic spine: Secondary | ICD-10-CM | POA: Diagnosis not present

## 2014-09-28 DIAGNOSIS — M9903 Segmental and somatic dysfunction of lumbar region: Secondary | ICD-10-CM | POA: Diagnosis not present

## 2014-09-28 DIAGNOSIS — M9902 Segmental and somatic dysfunction of thoracic region: Secondary | ICD-10-CM | POA: Diagnosis not present

## 2014-09-28 DIAGNOSIS — M9901 Segmental and somatic dysfunction of cervical region: Secondary | ICD-10-CM | POA: Diagnosis not present

## 2014-09-28 DIAGNOSIS — M5032 Other cervical disc degeneration, mid-cervical region: Secondary | ICD-10-CM | POA: Diagnosis not present

## 2014-09-28 DIAGNOSIS — M545 Low back pain: Secondary | ICD-10-CM | POA: Diagnosis not present

## 2014-09-28 DIAGNOSIS — M546 Pain in thoracic spine: Secondary | ICD-10-CM | POA: Diagnosis not present

## 2014-10-10 ENCOUNTER — Ambulatory Visit (INDEPENDENT_AMBULATORY_CARE_PROVIDER_SITE_OTHER): Payer: Medicare Other | Admitting: Nurse Practitioner

## 2014-10-10 ENCOUNTER — Ambulatory Visit: Payer: Medicare Other | Admitting: Gynecology

## 2014-10-10 ENCOUNTER — Encounter: Payer: Self-pay | Admitting: Nurse Practitioner

## 2014-10-10 VITALS — BP 132/64 | HR 52 | Ht 61.0 in | Wt 99.0 lb

## 2014-10-10 DIAGNOSIS — Z780 Asymptomatic menopausal state: Secondary | ICD-10-CM

## 2014-10-10 DIAGNOSIS — Z01419 Encounter for gynecological examination (general) (routine) without abnormal findings: Secondary | ICD-10-CM

## 2014-10-10 DIAGNOSIS — M810 Age-related osteoporosis without current pathological fracture: Secondary | ICD-10-CM | POA: Diagnosis not present

## 2014-10-10 DIAGNOSIS — Z01411 Encounter for gynecological examination (general) (routine) with abnormal findings: Secondary | ICD-10-CM

## 2014-10-10 DIAGNOSIS — R938 Abnormal findings on diagnostic imaging of other specified body structures: Secondary | ICD-10-CM | POA: Diagnosis not present

## 2014-10-10 NOTE — Patient Instructions (Signed)

## 2014-10-10 NOTE — Progress Notes (Signed)
Patient ID: Emily Jackson, female   DOB: Dec 12, 1922, 81 y.o.   MRN: [id number] 81 y.o. G2P2 Widowed  Caucasian Fe here for annual exam.  Doing well.  Sees PCP and cardiologist.  Labs were done by Dr. Drema Dallas.  She denies any recent health problems.  She has just returned from a trip to [place] and has been looking for  A home to move there closer to her 2 children.   Husband passed 2 year ago.   No LMP recorded. Patient has had a hysterectomy.  12/1990        Sexually active: No.  The current method of family planning is abstinence and status post hysterectomy.    Exercising: Yes.    walking everyday depending on weather Smoker:  no  Health Maintenance: Pap:  Unsure, but no history of abnormal MMG:  2016, normal, [facility] Colonoscopy:  2012 BMD:  Osteoporosis wit history of Actonel for 9 years. TDaP:  PCP has records Labs:  PCP, Leighton Ruff, MD   reports that she has never smoked. She has never used smokeless tobacco. She reports that she drinks about 3.0 oz of alcohol per week. She reports that she does not use illicit drugs.  Past Medical History  Diagnosis Date  . Pneumonia 05/2012  . Glaucoma   . Hypertension   . Migraines   . Hyperlipemia   . Bronchitis 06/2013  . Osteoporosis     actonel 9y    Past Surgical History  Procedure Laterality Date  . Appendectomy    . Vaginal hysterectomy      BSO,A&P repair    Current Outpatient Prescriptions  Medication Sig Dispense Refill  . acetaminophen-codeine (TYLENOL #3) 300-30 MG per tablet Take 1 tablet by mouth daily as needed.     Marland Kitchen amLODipine (NORVASC) 5 MG tablet Take 1 tablet (5 mg total) by mouth 2 (two) times daily. 180 tablet 3  . Ascorbic Acid (VITAMIN C PO) Take by mouth daily.    Marland Kitchen atenolol (TENORMIN) 50 MG tablet Take 0.5 tablets (25 mg total) by mouth daily. Take as directed 45 tablet 3  . atorvastatin (LIPITOR) 10 MG tablet Take 10 mg by mouth daily.    . AZOPT 1 % ophthalmic suspension Place 1 drop into both eyes  2 (two) times daily.     . bimatoprost (LUMIGAN) 0.01 % SOLN Place 1 drop into both eyes at bedtime.     . brimonidine-timolol (COMBIGAN) 0.2-0.5 % ophthalmic solution Place 1 drop into both eyes every 12 (twelve) hours.    . butalbital-aspirin-caffeine (FIORINAL) 50-325-40 MG per capsule Take 1 capsule by mouth as needed for headache.    Marland Kitchen CALCIUM PO Take 1,200 mg by mouth daily.    . cetirizine (ZYRTEC) 10 MG tablet Take 10 mg by mouth daily.    . cholecalciferol (VITAMIN D) 1000 UNITS tablet Take 1,000 Units by mouth daily.    . fluticasone (FLONASE) 50 MCG/ACT nasal spray Place 2 sprays into the nose daily.    . furosemide (LASIX) 20 MG tablet Take 20 mg by mouth daily.     Marland Kitchen gabapentin (NEURONTIN) 600 MG tablet Take 300 mg by mouth at bedtime.    Marland Kitchen ipratropium (ATROVENT) 0.03 % nasal spray Place 2 sprays into the nose every 12 (twelve) hours.    Marland Kitchen levothyroxine (SYNTHROID, LEVOTHROID) 50 MCG tablet Take 50 mcg by mouth daily before breakfast.    . lisinopril (PRINIVIL,ZESTRIL) 20 MG tablet Take 1 tablet (20 mg total) by mouth  2 (two) times daily. 180 tablet 3  . VITAMIN E PO Take 1 tablet by mouth daily.     No current facility-administered medications for this visit.    Family History  Problem Relation Age of Onset  . Hypertension Mother   . Hypertension Father   . Heart disease Father     ROS:  Pertinent items are noted in HPI.  Otherwise, a comprehensive ROS was negative.  Exam:   BP 132/64 mmHg  Pulse 52  Ht 5\' 1"  (1.549 m)  Wt 99 lb (44.906 kg)  BMI 18.72 kg/m2 Height: 5\' 1"  (154.9 cm) Ht Readings from Last 3 Encounters:  10/10/14 5\' 1"  (1.549 m)  09/12/14 5' (1.524 m)  10/08/13 5' (1.524 m)    General appearance: alert, cooperative and appears stated age Head: Normocephalic, without obvious abnormality, atraumatic Neck: no adenopathy, supple, symmetrical, trachea midline and thyroid normal to inspection and palpation Lungs: clear to auscultation  bilaterally Breasts: normal appearance, no masses or tenderness Heart: regular rate and rhythm Abdomen: soft, non-tender; no masses,  no organomegaly Extremities: extremities normal, atraumatic, no cyanosis or edema Skin: Skin color, texture, turgor normal. No rashes or lesions Lymph nodes: Cervical, supraclavicular, and axillary nodes normal. No abnormal inguinal nodes palpated Neurologic: Grossly normal   Pelvic: External genitalia:  no lesions              Urethra:  normal appearing urethra with no masses, tenderness or lesions              Bartholin's and Skene's: normal                 Vagina: normal appearing vagina with normal color and discharge, no lesions              Cervix: absent              Pap taken: No. Bimanual Exam:  Uterus:  uterus absent              Adnexa: positive for: mass mid line in pelvis, also evaluated by Dr. Quincy Simmonds               Rectovaginal: there was not initially stool in the vault but after several attempts to evaluate the ? Mass.  Dr. Quincy Simmonds was able to feel stool in the vault.               Anus:  normal sphincter tone, no lesions  Chaperone present: yes  A:  Well Woman with normal exam  S/P TVH, A & P repair, BSO 12/1990  Osteoporosis; quit Actonel Nov 2010 after 9 years of use  Mass on pelvic exam - most likely is stool in the vault  P:   Reviewed health and wellness pertinent to exam  Pap smear not taken today  Mammogram is normal and we will get a copy.  She is instructed to take a stool softener as she is doing and add Miralax every other day to get a good bowel evacuation and then return in a week for another pelvic exam.  Counseled on breast self exam, mammography screening, adequate intake of calcium and vitamin D, diet and exercise, Kegel's exercises return annually or prn  An After Visit Summary was printed and given to the patient.

## 2014-10-11 ENCOUNTER — Encounter: Payer: Self-pay | Admitting: Nurse Practitioner

## 2014-10-12 DIAGNOSIS — M9901 Segmental and somatic dysfunction of cervical region: Secondary | ICD-10-CM | POA: Diagnosis not present

## 2014-10-12 DIAGNOSIS — M546 Pain in thoracic spine: Secondary | ICD-10-CM | POA: Diagnosis not present

## 2014-10-12 DIAGNOSIS — M5032 Other cervical disc degeneration, mid-cervical region: Secondary | ICD-10-CM | POA: Diagnosis not present

## 2014-10-12 DIAGNOSIS — M545 Low back pain: Secondary | ICD-10-CM | POA: Diagnosis not present

## 2014-10-12 DIAGNOSIS — M9903 Segmental and somatic dysfunction of lumbar region: Secondary | ICD-10-CM | POA: Diagnosis not present

## 2014-10-12 DIAGNOSIS — M9902 Segmental and somatic dysfunction of thoracic region: Secondary | ICD-10-CM | POA: Diagnosis not present

## 2014-10-13 NOTE — Progress Notes (Signed)
Encounter reviewed by Dr. Josefa Half. Pelvic exam personally performed by me.  I believe the pelvic mass is stool and I do recommend a re-examination after using Miralax.

## 2014-10-17 ENCOUNTER — Ambulatory Visit (INDEPENDENT_AMBULATORY_CARE_PROVIDER_SITE_OTHER): Payer: Medicare Other | Admitting: Nurse Practitioner

## 2014-10-17 ENCOUNTER — Encounter: Payer: Self-pay | Admitting: Nurse Practitioner

## 2014-10-17 VITALS — BP 104/60 | HR 64 | Ht 61.0 in | Wt 97.0 lb

## 2014-10-17 DIAGNOSIS — Z01411 Encounter for gynecological examination (general) (routine) with abnormal findings: Secondary | ICD-10-CM

## 2014-10-17 DIAGNOSIS — R938 Abnormal findings on diagnostic imaging of other specified body structures: Secondary | ICD-10-CM | POA: Diagnosis not present

## 2014-10-17 NOTE — Progress Notes (Signed)
81 y.o. this [employer] G2P2 Fe returns today to follow up on AEX done last week. She is S/P vaginal hysterectomy and BSO with A&P repair in 1999.  On her physical pelvic exam there seemed to be a mass or presence of stool that could be felt.  She was also examined by Dr. Quincy Simmonds.   There was possibility that she had stool in the rectum and this could be the cause of 'mass'.  She was asked to take her regular stool softer and extra Miralax to completely evacuate the bowel prior to re-examination.  She has done that and has had good BM's. She denies abdominal pain, nausea/ vomiting, fever or chills.  She denies weight loss or any sense of not being well.  She remains active and packing some things for her move.      O:  Healthy WD,WN female  Affect: normal and pleasant  Skin: no discolorations, warm and dry  Abdomen:soft, non tender, normal bowel sounds  Pelvic exam:EXTERNAL GENITALIA: normal appearing vulva with no masses, tenderness  or lesions  VAGINA: no abnormal discharge or lesions  ADNEXA: no masses palpable and non tender  RECTAL:  Confirms no stool  A: Pelvic mass ? -  Resolved   Most likely related to stool in the rectum at time of exam   P:  Discussed findings of no mass and no concern to pursue with CT scan/ PUS  No changes in her usual routine   Labs :  Not indicated  Since she will be moving - given instructions for transfer of care.  Instructions given:  That she may return prn  RV

## 2014-10-17 NOTE — Patient Instructions (Signed)
Return as needed

## 2014-10-22 NOTE — Progress Notes (Signed)
Encounter reviewed by Dr. Brook Silva.  

## 2014-10-26 DIAGNOSIS — H4011X3 Primary open-angle glaucoma, severe stage: Secondary | ICD-10-CM | POA: Diagnosis not present

## 2014-10-26 DIAGNOSIS — Z961 Presence of intraocular lens: Secondary | ICD-10-CM | POA: Diagnosis not present

## 2014-10-26 DIAGNOSIS — H4011X1 Primary open-angle glaucoma, mild stage: Secondary | ICD-10-CM | POA: Diagnosis not present

## 2014-10-27 DIAGNOSIS — M5032 Other cervical disc degeneration, mid-cervical region: Secondary | ICD-10-CM | POA: Diagnosis not present

## 2014-10-27 DIAGNOSIS — M545 Low back pain: Secondary | ICD-10-CM | POA: Diagnosis not present

## 2014-10-27 DIAGNOSIS — M9901 Segmental and somatic dysfunction of cervical region: Secondary | ICD-10-CM | POA: Diagnosis not present

## 2014-10-27 DIAGNOSIS — M9902 Segmental and somatic dysfunction of thoracic region: Secondary | ICD-10-CM | POA: Diagnosis not present

## 2014-10-27 DIAGNOSIS — M9903 Segmental and somatic dysfunction of lumbar region: Secondary | ICD-10-CM | POA: Diagnosis not present

## 2014-10-27 DIAGNOSIS — M546 Pain in thoracic spine: Secondary | ICD-10-CM | POA: Diagnosis not present

## 2014-11-04 DIAGNOSIS — R51 Headache: Secondary | ICD-10-CM | POA: Diagnosis not present

## 2014-11-04 DIAGNOSIS — Z0000 Encounter for general adult medical examination without abnormal findings: Secondary | ICD-10-CM | POA: Diagnosis not present

## 2014-11-04 DIAGNOSIS — M810 Age-related osteoporosis without current pathological fracture: Secondary | ICD-10-CM | POA: Diagnosis not present

## 2014-11-04 DIAGNOSIS — E039 Hypothyroidism, unspecified: Secondary | ICD-10-CM | POA: Diagnosis not present

## 2014-11-04 DIAGNOSIS — I10 Essential (primary) hypertension: Secondary | ICD-10-CM | POA: Diagnosis not present

## 2014-11-04 DIAGNOSIS — Z1389 Encounter for screening for other disorder: Secondary | ICD-10-CM | POA: Diagnosis not present

## 2014-11-04 DIAGNOSIS — E785 Hyperlipidemia, unspecified: Secondary | ICD-10-CM | POA: Diagnosis not present

## 2014-11-04 DIAGNOSIS — Z23 Encounter for immunization: Secondary | ICD-10-CM | POA: Diagnosis not present

## 2014-11-09 DIAGNOSIS — M9902 Segmental and somatic dysfunction of thoracic region: Secondary | ICD-10-CM | POA: Diagnosis not present

## 2014-11-09 DIAGNOSIS — M9901 Segmental and somatic dysfunction of cervical region: Secondary | ICD-10-CM | POA: Diagnosis not present

## 2014-11-09 DIAGNOSIS — M5032 Other cervical disc degeneration, mid-cervical region: Secondary | ICD-10-CM | POA: Diagnosis not present

## 2014-11-09 DIAGNOSIS — M545 Low back pain: Secondary | ICD-10-CM | POA: Diagnosis not present

## 2014-11-09 DIAGNOSIS — M9903 Segmental and somatic dysfunction of lumbar region: Secondary | ICD-10-CM | POA: Diagnosis not present

## 2014-11-09 DIAGNOSIS — M546 Pain in thoracic spine: Secondary | ICD-10-CM | POA: Diagnosis not present

## 2014-11-23 DIAGNOSIS — M545 Low back pain: Secondary | ICD-10-CM | POA: Diagnosis not present

## 2014-11-23 DIAGNOSIS — M9902 Segmental and somatic dysfunction of thoracic region: Secondary | ICD-10-CM | POA: Diagnosis not present

## 2014-11-23 DIAGNOSIS — M5032 Other cervical disc degeneration, mid-cervical region: Secondary | ICD-10-CM | POA: Diagnosis not present

## 2014-11-23 DIAGNOSIS — M9903 Segmental and somatic dysfunction of lumbar region: Secondary | ICD-10-CM | POA: Diagnosis not present

## 2014-11-23 DIAGNOSIS — M9901 Segmental and somatic dysfunction of cervical region: Secondary | ICD-10-CM | POA: Diagnosis not present

## 2014-11-23 DIAGNOSIS — M546 Pain in thoracic spine: Secondary | ICD-10-CM | POA: Diagnosis not present

## 2014-12-07 DIAGNOSIS — M546 Pain in thoracic spine: Secondary | ICD-10-CM | POA: Diagnosis not present

## 2014-12-07 DIAGNOSIS — M545 Low back pain: Secondary | ICD-10-CM | POA: Diagnosis not present

## 2014-12-07 DIAGNOSIS — M9902 Segmental and somatic dysfunction of thoracic region: Secondary | ICD-10-CM | POA: Diagnosis not present

## 2014-12-07 DIAGNOSIS — M9903 Segmental and somatic dysfunction of lumbar region: Secondary | ICD-10-CM | POA: Diagnosis not present

## 2014-12-07 DIAGNOSIS — M9901 Segmental and somatic dysfunction of cervical region: Secondary | ICD-10-CM | POA: Diagnosis not present

## 2014-12-07 DIAGNOSIS — M5032 Other cervical disc degeneration, mid-cervical region: Secondary | ICD-10-CM | POA: Diagnosis not present

## 2014-12-28 DIAGNOSIS — M546 Pain in thoracic spine: Secondary | ICD-10-CM | POA: Diagnosis not present

## 2014-12-28 DIAGNOSIS — M545 Low back pain: Secondary | ICD-10-CM | POA: Diagnosis not present

## 2014-12-28 DIAGNOSIS — M5032 Other cervical disc degeneration, mid-cervical region: Secondary | ICD-10-CM | POA: Diagnosis not present

## 2014-12-28 DIAGNOSIS — M9903 Segmental and somatic dysfunction of lumbar region: Secondary | ICD-10-CM | POA: Diagnosis not present

## 2014-12-28 DIAGNOSIS — M9902 Segmental and somatic dysfunction of thoracic region: Secondary | ICD-10-CM | POA: Diagnosis not present

## 2014-12-28 DIAGNOSIS — M9901 Segmental and somatic dysfunction of cervical region: Secondary | ICD-10-CM | POA: Diagnosis not present

## 2015-02-01 DIAGNOSIS — M9903 Segmental and somatic dysfunction of lumbar region: Secondary | ICD-10-CM | POA: Diagnosis not present

## 2015-02-01 DIAGNOSIS — M5032 Other cervical disc degeneration, mid-cervical region: Secondary | ICD-10-CM | POA: Diagnosis not present

## 2015-02-01 DIAGNOSIS — M9902 Segmental and somatic dysfunction of thoracic region: Secondary | ICD-10-CM | POA: Diagnosis not present

## 2015-02-01 DIAGNOSIS — M546 Pain in thoracic spine: Secondary | ICD-10-CM | POA: Diagnosis not present

## 2015-02-01 DIAGNOSIS — M545 Low back pain: Secondary | ICD-10-CM | POA: Diagnosis not present

## 2015-02-01 DIAGNOSIS — M9901 Segmental and somatic dysfunction of cervical region: Secondary | ICD-10-CM | POA: Diagnosis not present

## 2015-02-15 DIAGNOSIS — M545 Low back pain: Secondary | ICD-10-CM | POA: Diagnosis not present

## 2015-02-15 DIAGNOSIS — M5032 Other cervical disc degeneration, mid-cervical region: Secondary | ICD-10-CM | POA: Diagnosis not present

## 2015-02-15 DIAGNOSIS — M9901 Segmental and somatic dysfunction of cervical region: Secondary | ICD-10-CM | POA: Diagnosis not present

## 2015-02-15 DIAGNOSIS — M546 Pain in thoracic spine: Secondary | ICD-10-CM | POA: Diagnosis not present

## 2015-02-15 DIAGNOSIS — M9903 Segmental and somatic dysfunction of lumbar region: Secondary | ICD-10-CM | POA: Diagnosis not present

## 2015-02-15 DIAGNOSIS — M9902 Segmental and somatic dysfunction of thoracic region: Secondary | ICD-10-CM | POA: Diagnosis not present

## 2015-03-01 DIAGNOSIS — H01022 Squamous blepharitis right lower eyelid: Secondary | ICD-10-CM | POA: Diagnosis not present

## 2015-03-01 DIAGNOSIS — H01025 Squamous blepharitis left lower eyelid: Secondary | ICD-10-CM | POA: Diagnosis not present

## 2015-03-01 DIAGNOSIS — H4011X2 Primary open-angle glaucoma, moderate stage: Secondary | ICD-10-CM | POA: Diagnosis not present

## 2015-03-01 DIAGNOSIS — H01021 Squamous blepharitis right upper eyelid: Secondary | ICD-10-CM | POA: Diagnosis not present

## 2015-03-01 DIAGNOSIS — H01024 Squamous blepharitis left upper eyelid: Secondary | ICD-10-CM | POA: Diagnosis not present

## 2015-03-01 DIAGNOSIS — H10413 Chronic giant papillary conjunctivitis, bilateral: Secondary | ICD-10-CM | POA: Diagnosis not present

## 2015-03-01 DIAGNOSIS — H4011X3 Primary open-angle glaucoma, severe stage: Secondary | ICD-10-CM | POA: Diagnosis not present

## 2015-03-07 DIAGNOSIS — M545 Low back pain: Secondary | ICD-10-CM | POA: Diagnosis not present

## 2015-03-07 DIAGNOSIS — M546 Pain in thoracic spine: Secondary | ICD-10-CM | POA: Diagnosis not present

## 2015-03-07 DIAGNOSIS — M9901 Segmental and somatic dysfunction of cervical region: Secondary | ICD-10-CM | POA: Diagnosis not present

## 2015-03-07 DIAGNOSIS — M5032 Other cervical disc degeneration, mid-cervical region: Secondary | ICD-10-CM | POA: Diagnosis not present

## 2015-03-07 DIAGNOSIS — M9902 Segmental and somatic dysfunction of thoracic region: Secondary | ICD-10-CM | POA: Diagnosis not present

## 2015-03-07 DIAGNOSIS — M9903 Segmental and somatic dysfunction of lumbar region: Secondary | ICD-10-CM | POA: Diagnosis not present

## 2015-03-09 DIAGNOSIS — L03119 Cellulitis of unspecified part of limb: Secondary | ICD-10-CM | POA: Diagnosis not present

## 2015-03-21 DIAGNOSIS — M546 Pain in thoracic spine: Secondary | ICD-10-CM | POA: Diagnosis not present

## 2015-03-21 DIAGNOSIS — M9901 Segmental and somatic dysfunction of cervical region: Secondary | ICD-10-CM | POA: Diagnosis not present

## 2015-03-21 DIAGNOSIS — M545 Low back pain: Secondary | ICD-10-CM | POA: Diagnosis not present

## 2015-03-21 DIAGNOSIS — M9903 Segmental and somatic dysfunction of lumbar region: Secondary | ICD-10-CM | POA: Diagnosis not present

## 2015-03-21 DIAGNOSIS — M50323 Other cervical disc degeneration at C6-C7 level: Secondary | ICD-10-CM | POA: Diagnosis not present

## 2015-03-21 DIAGNOSIS — M50322 Other cervical disc degeneration at C5-C6 level: Secondary | ICD-10-CM | POA: Diagnosis not present

## 2015-03-21 DIAGNOSIS — M9902 Segmental and somatic dysfunction of thoracic region: Secondary | ICD-10-CM | POA: Diagnosis not present

## 2015-03-21 DIAGNOSIS — M50321 Other cervical disc degeneration at C4-C5 level: Secondary | ICD-10-CM | POA: Diagnosis not present

## 2015-03-30 DIAGNOSIS — Z23 Encounter for immunization: Secondary | ICD-10-CM | POA: Diagnosis not present

## 2015-04-04 DIAGNOSIS — M50321 Other cervical disc degeneration at C4-C5 level: Secondary | ICD-10-CM | POA: Diagnosis not present

## 2015-04-04 DIAGNOSIS — M50322 Other cervical disc degeneration at C5-C6 level: Secondary | ICD-10-CM | POA: Diagnosis not present

## 2015-04-04 DIAGNOSIS — M9903 Segmental and somatic dysfunction of lumbar region: Secondary | ICD-10-CM | POA: Diagnosis not present

## 2015-04-04 DIAGNOSIS — M545 Low back pain: Secondary | ICD-10-CM | POA: Diagnosis not present

## 2015-04-04 DIAGNOSIS — M9901 Segmental and somatic dysfunction of cervical region: Secondary | ICD-10-CM | POA: Diagnosis not present

## 2015-04-04 DIAGNOSIS — M50323 Other cervical disc degeneration at C6-C7 level: Secondary | ICD-10-CM | POA: Diagnosis not present

## 2015-04-04 DIAGNOSIS — M9902 Segmental and somatic dysfunction of thoracic region: Secondary | ICD-10-CM | POA: Diagnosis not present

## 2015-04-04 DIAGNOSIS — M546 Pain in thoracic spine: Secondary | ICD-10-CM | POA: Diagnosis not present

## 2015-04-18 DIAGNOSIS — I10 Essential (primary) hypertension: Secondary | ICD-10-CM | POA: Diagnosis not present

## 2015-04-18 DIAGNOSIS — R51 Headache: Secondary | ICD-10-CM | POA: Diagnosis not present

## 2015-04-18 DIAGNOSIS — E039 Hypothyroidism, unspecified: Secondary | ICD-10-CM | POA: Diagnosis not present

## 2015-04-18 DIAGNOSIS — Z79899 Other long term (current) drug therapy: Secondary | ICD-10-CM | POA: Diagnosis not present

## 2015-04-18 DIAGNOSIS — E785 Hyperlipidemia, unspecified: Secondary | ICD-10-CM | POA: Diagnosis not present

## 2015-04-18 DIAGNOSIS — M810 Age-related osteoporosis without current pathological fracture: Secondary | ICD-10-CM | POA: Diagnosis not present

## 2015-04-18 DIAGNOSIS — L03119 Cellulitis of unspecified part of limb: Secondary | ICD-10-CM | POA: Diagnosis not present

## 2015-04-20 DIAGNOSIS — M50321 Other cervical disc degeneration at C4-C5 level: Secondary | ICD-10-CM | POA: Diagnosis not present

## 2015-04-20 DIAGNOSIS — M545 Low back pain: Secondary | ICD-10-CM | POA: Diagnosis not present

## 2015-04-20 DIAGNOSIS — M9901 Segmental and somatic dysfunction of cervical region: Secondary | ICD-10-CM | POA: Diagnosis not present

## 2015-04-20 DIAGNOSIS — M9903 Segmental and somatic dysfunction of lumbar region: Secondary | ICD-10-CM | POA: Diagnosis not present

## 2015-04-20 DIAGNOSIS — M546 Pain in thoracic spine: Secondary | ICD-10-CM | POA: Diagnosis not present

## 2015-04-20 DIAGNOSIS — M50322 Other cervical disc degeneration at C5-C6 level: Secondary | ICD-10-CM | POA: Diagnosis not present

## 2015-04-20 DIAGNOSIS — M9902 Segmental and somatic dysfunction of thoracic region: Secondary | ICD-10-CM | POA: Diagnosis not present

## 2015-04-20 DIAGNOSIS — M50323 Other cervical disc degeneration at C6-C7 level: Secondary | ICD-10-CM | POA: Diagnosis not present

## 2015-05-10 DIAGNOSIS — M9901 Segmental and somatic dysfunction of cervical region: Secondary | ICD-10-CM | POA: Diagnosis not present

## 2015-05-10 DIAGNOSIS — M50322 Other cervical disc degeneration at C5-C6 level: Secondary | ICD-10-CM | POA: Diagnosis not present

## 2015-05-10 DIAGNOSIS — M9902 Segmental and somatic dysfunction of thoracic region: Secondary | ICD-10-CM | POA: Diagnosis not present

## 2015-05-10 DIAGNOSIS — M50321 Other cervical disc degeneration at C4-C5 level: Secondary | ICD-10-CM | POA: Diagnosis not present

## 2015-05-10 DIAGNOSIS — M9903 Segmental and somatic dysfunction of lumbar region: Secondary | ICD-10-CM | POA: Diagnosis not present

## 2015-05-10 DIAGNOSIS — M50323 Other cervical disc degeneration at C6-C7 level: Secondary | ICD-10-CM | POA: Diagnosis not present

## 2015-05-10 DIAGNOSIS — M546 Pain in thoracic spine: Secondary | ICD-10-CM | POA: Diagnosis not present

## 2015-05-10 DIAGNOSIS — M545 Low back pain: Secondary | ICD-10-CM | POA: Diagnosis not present

## 2015-07-07 DIAGNOSIS — E7800 Pure hypercholesterolemia, unspecified: Secondary | ICD-10-CM | POA: Diagnosis not present

## 2015-07-07 DIAGNOSIS — E039 Hypothyroidism, unspecified: Secondary | ICD-10-CM | POA: Diagnosis not present

## 2015-07-07 DIAGNOSIS — Z760 Encounter for issue of repeat prescription: Secondary | ICD-10-CM | POA: Diagnosis not present

## 2015-07-07 DIAGNOSIS — I10 Essential (primary) hypertension: Secondary | ICD-10-CM | POA: Diagnosis not present

## 2015-07-11 DIAGNOSIS — H401133 Primary open-angle glaucoma, bilateral, severe stage: Secondary | ICD-10-CM | POA: Diagnosis not present

## 2015-07-11 DIAGNOSIS — H04123 Dry eye syndrome of bilateral lacrimal glands: Secondary | ICD-10-CM | POA: Diagnosis not present

## 2015-07-11 DIAGNOSIS — Z961 Presence of intraocular lens: Secondary | ICD-10-CM | POA: Diagnosis not present

## 2015-07-13 DIAGNOSIS — E039 Hypothyroidism, unspecified: Secondary | ICD-10-CM | POA: Diagnosis not present

## 2015-07-13 DIAGNOSIS — E7800 Pure hypercholesterolemia, unspecified: Secondary | ICD-10-CM | POA: Diagnosis not present

## 2015-07-13 DIAGNOSIS — I10 Essential (primary) hypertension: Secondary | ICD-10-CM | POA: Diagnosis not present

## 2015-08-16 DIAGNOSIS — T887XXA Unspecified adverse effect of drug or medicament, initial encounter: Secondary | ICD-10-CM | POA: Diagnosis not present

## 2015-09-07 DIAGNOSIS — L309 Dermatitis, unspecified: Secondary | ICD-10-CM | POA: Diagnosis not present

## 2015-09-20 ENCOUNTER — Telehealth: Payer: Self-pay | Admitting: Nurse Practitioner

## 2015-09-20 NOTE — Telephone Encounter (Signed)
Patient received her recall letter for aex and wanted to let Chong Sicilian know that she is living in [place] with her children now. She has been taken out of recall.

## 2015-09-22 ENCOUNTER — Other Ambulatory Visit: Payer: Self-pay

## 2015-09-22 MED ORDER — AMLODIPINE BESYLATE 5 MG PO TABS: 5.0000 mg | ORAL_TABLET | Freq: Two times a day (BID) | ORAL | Status: AC

## 2015-11-08 DIAGNOSIS — H04123 Dry eye syndrome of bilateral lacrimal glands: Secondary | ICD-10-CM | POA: Diagnosis not present

## 2015-11-08 DIAGNOSIS — H401133 Primary open-angle glaucoma, bilateral, severe stage: Secondary | ICD-10-CM | POA: Diagnosis not present

## 2015-11-08 DIAGNOSIS — Z961 Presence of intraocular lens: Secondary | ICD-10-CM | POA: Diagnosis not present

## 2015-12-29 DIAGNOSIS — E039 Hypothyroidism, unspecified: Secondary | ICD-10-CM | POA: Diagnosis not present

## 2015-12-29 DIAGNOSIS — D696 Thrombocytopenia, unspecified: Secondary | ICD-10-CM | POA: Diagnosis not present

## 2015-12-29 DIAGNOSIS — E7800 Pure hypercholesterolemia, unspecified: Secondary | ICD-10-CM | POA: Diagnosis not present

## 2015-12-29 DIAGNOSIS — I10 Essential (primary) hypertension: Secondary | ICD-10-CM | POA: Diagnosis not present

## 2016-01-08 DIAGNOSIS — D696 Thrombocytopenia, unspecified: Secondary | ICD-10-CM | POA: Diagnosis not present

## 2016-01-08 DIAGNOSIS — E7800 Pure hypercholesterolemia, unspecified: Secondary | ICD-10-CM | POA: Diagnosis not present

## 2016-01-08 DIAGNOSIS — I10 Essential (primary) hypertension: Secondary | ICD-10-CM | POA: Diagnosis not present

## 2016-01-08 DIAGNOSIS — G43709 Chronic migraine without aura, not intractable, without status migrainosus: Secondary | ICD-10-CM | POA: Diagnosis not present

## 2016-01-08 DIAGNOSIS — E039 Hypothyroidism, unspecified: Secondary | ICD-10-CM | POA: Diagnosis not present

## 2016-01-08 DIAGNOSIS — I481 Persistent atrial fibrillation: Secondary | ICD-10-CM | POA: Diagnosis not present

## 2016-01-12 DIAGNOSIS — I481 Persistent atrial fibrillation: Secondary | ICD-10-CM | POA: Diagnosis not present

## 2016-01-12 DIAGNOSIS — I10 Essential (primary) hypertension: Secondary | ICD-10-CM | POA: Diagnosis not present

## 2016-01-15 DIAGNOSIS — I481 Persistent atrial fibrillation: Secondary | ICD-10-CM | POA: Diagnosis not present

## 2016-01-17 DIAGNOSIS — I10 Essential (primary) hypertension: Secondary | ICD-10-CM | POA: Diagnosis not present

## 2016-01-17 DIAGNOSIS — I4891 Unspecified atrial fibrillation: Secondary | ICD-10-CM | POA: Diagnosis not present

## 2016-01-17 DIAGNOSIS — I481 Persistent atrial fibrillation: Secondary | ICD-10-CM | POA: Diagnosis not present

## 2016-01-19 DIAGNOSIS — I481 Persistent atrial fibrillation: Secondary | ICD-10-CM | POA: Diagnosis not present

## 2016-01-19 DIAGNOSIS — I5022 Chronic systolic (congestive) heart failure: Secondary | ICD-10-CM | POA: Diagnosis not present

## 2016-01-19 DIAGNOSIS — I10 Essential (primary) hypertension: Secondary | ICD-10-CM | POA: Diagnosis not present

## 2016-02-26 DIAGNOSIS — M9902 Segmental and somatic dysfunction of thoracic region: Secondary | ICD-10-CM | POA: Diagnosis not present

## 2016-02-26 DIAGNOSIS — M7540 Impingement syndrome of unspecified shoulder: Secondary | ICD-10-CM | POA: Diagnosis not present

## 2016-02-26 DIAGNOSIS — M47814 Spondylosis without myelopathy or radiculopathy, thoracic region: Secondary | ICD-10-CM | POA: Diagnosis not present

## 2016-02-26 DIAGNOSIS — M47812 Spondylosis without myelopathy or radiculopathy, cervical region: Secondary | ICD-10-CM | POA: Diagnosis not present

## 2016-02-26 DIAGNOSIS — M9901 Segmental and somatic dysfunction of cervical region: Secondary | ICD-10-CM | POA: Diagnosis not present

## 2016-02-28 DIAGNOSIS — M25511 Pain in right shoulder: Secondary | ICD-10-CM | POA: Diagnosis not present

## 2016-02-28 DIAGNOSIS — M7540 Impingement syndrome of unspecified shoulder: Secondary | ICD-10-CM | POA: Diagnosis not present

## 2016-02-28 DIAGNOSIS — M47812 Spondylosis without myelopathy or radiculopathy, cervical region: Secondary | ICD-10-CM | POA: Diagnosis not present

## 2016-02-28 DIAGNOSIS — M4312 Spondylolisthesis, cervical region: Secondary | ICD-10-CM | POA: Diagnosis not present

## 2016-02-28 DIAGNOSIS — S22080A Wedge compression fracture of T11-T12 vertebra, initial encounter for closed fracture: Secondary | ICD-10-CM | POA: Diagnosis not present

## 2016-02-28 DIAGNOSIS — M47814 Spondylosis without myelopathy or radiculopathy, thoracic region: Secondary | ICD-10-CM | POA: Diagnosis not present

## 2016-03-06 DIAGNOSIS — M5030 Other cervical disc degeneration, unspecified cervical region: Secondary | ICD-10-CM | POA: Diagnosis not present

## 2016-03-06 DIAGNOSIS — M9902 Segmental and somatic dysfunction of thoracic region: Secondary | ICD-10-CM | POA: Diagnosis not present

## 2016-03-06 DIAGNOSIS — M791 Myalgia: Secondary | ICD-10-CM | POA: Diagnosis not present

## 2016-03-06 DIAGNOSIS — M47814 Spondylosis without myelopathy or radiculopathy, thoracic region: Secondary | ICD-10-CM | POA: Diagnosis not present

## 2016-03-06 DIAGNOSIS — M9901 Segmental and somatic dysfunction of cervical region: Secondary | ICD-10-CM | POA: Diagnosis not present

## 2016-03-06 DIAGNOSIS — M47812 Spondylosis without myelopathy or radiculopathy, cervical region: Secondary | ICD-10-CM | POA: Diagnosis not present

## 2016-03-11 DIAGNOSIS — H401133 Primary open-angle glaucoma, bilateral, severe stage: Secondary | ICD-10-CM | POA: Diagnosis not present

## 2016-03-11 DIAGNOSIS — Z961 Presence of intraocular lens: Secondary | ICD-10-CM | POA: Diagnosis not present

## 2016-03-11 DIAGNOSIS — H04123 Dry eye syndrome of bilateral lacrimal glands: Secondary | ICD-10-CM | POA: Diagnosis not present

## 2016-03-13 DIAGNOSIS — M6240 Contracture of muscle, unspecified site: Secondary | ICD-10-CM | POA: Diagnosis not present

## 2016-03-13 DIAGNOSIS — M791 Myalgia: Secondary | ICD-10-CM | POA: Diagnosis not present

## 2016-03-13 DIAGNOSIS — M9902 Segmental and somatic dysfunction of thoracic region: Secondary | ICD-10-CM | POA: Diagnosis not present

## 2016-03-13 DIAGNOSIS — M47814 Spondylosis without myelopathy or radiculopathy, thoracic region: Secondary | ICD-10-CM | POA: Diagnosis not present

## 2016-03-13 DIAGNOSIS — M47812 Spondylosis without myelopathy or radiculopathy, cervical region: Secondary | ICD-10-CM | POA: Diagnosis not present

## 2016-03-13 DIAGNOSIS — M9901 Segmental and somatic dysfunction of cervical region: Secondary | ICD-10-CM | POA: Diagnosis not present

## 2016-03-21 DIAGNOSIS — Z23 Encounter for immunization: Secondary | ICD-10-CM | POA: Diagnosis not present

## 2016-03-27 DIAGNOSIS — M47814 Spondylosis without myelopathy or radiculopathy, thoracic region: Secondary | ICD-10-CM | POA: Diagnosis not present

## 2016-03-27 DIAGNOSIS — M6240 Contracture of muscle, unspecified site: Secondary | ICD-10-CM | POA: Diagnosis not present

## 2016-03-27 DIAGNOSIS — M9901 Segmental and somatic dysfunction of cervical region: Secondary | ICD-10-CM | POA: Diagnosis not present

## 2016-03-27 DIAGNOSIS — M47812 Spondylosis without myelopathy or radiculopathy, cervical region: Secondary | ICD-10-CM | POA: Diagnosis not present

## 2016-03-27 DIAGNOSIS — M9902 Segmental and somatic dysfunction of thoracic region: Secondary | ICD-10-CM | POA: Diagnosis not present

## 2016-03-27 DIAGNOSIS — M791 Myalgia: Secondary | ICD-10-CM | POA: Diagnosis not present

## 2016-04-08 DIAGNOSIS — M9902 Segmental and somatic dysfunction of thoracic region: Secondary | ICD-10-CM | POA: Diagnosis not present

## 2016-04-08 DIAGNOSIS — M6240 Contracture of muscle, unspecified site: Secondary | ICD-10-CM | POA: Diagnosis not present

## 2016-04-08 DIAGNOSIS — M47814 Spondylosis without myelopathy or radiculopathy, thoracic region: Secondary | ICD-10-CM | POA: Diagnosis not present

## 2016-04-08 DIAGNOSIS — M791 Myalgia: Secondary | ICD-10-CM | POA: Diagnosis not present

## 2016-04-08 DIAGNOSIS — M47812 Spondylosis without myelopathy or radiculopathy, cervical region: Secondary | ICD-10-CM | POA: Diagnosis not present

## 2016-04-08 DIAGNOSIS — M9901 Segmental and somatic dysfunction of cervical region: Secondary | ICD-10-CM | POA: Diagnosis not present

## 2016-04-15 DIAGNOSIS — E7800 Pure hypercholesterolemia, unspecified: Secondary | ICD-10-CM | POA: Diagnosis not present

## 2016-04-15 DIAGNOSIS — E039 Hypothyroidism, unspecified: Secondary | ICD-10-CM | POA: Diagnosis not present

## 2016-04-15 DIAGNOSIS — I481 Persistent atrial fibrillation: Secondary | ICD-10-CM | POA: Diagnosis not present

## 2016-04-15 DIAGNOSIS — I10 Essential (primary) hypertension: Secondary | ICD-10-CM | POA: Diagnosis not present

## 2016-04-15 DIAGNOSIS — D696 Thrombocytopenia, unspecified: Secondary | ICD-10-CM | POA: Diagnosis not present

## 2016-04-22 DIAGNOSIS — M9901 Segmental and somatic dysfunction of cervical region: Secondary | ICD-10-CM | POA: Diagnosis not present

## 2016-04-22 DIAGNOSIS — M6240 Contracture of muscle, unspecified site: Secondary | ICD-10-CM | POA: Diagnosis not present

## 2016-04-22 DIAGNOSIS — M47814 Spondylosis without myelopathy or radiculopathy, thoracic region: Secondary | ICD-10-CM | POA: Diagnosis not present

## 2016-04-22 DIAGNOSIS — M47812 Spondylosis without myelopathy or radiculopathy, cervical region: Secondary | ICD-10-CM | POA: Diagnosis not present

## 2016-04-22 DIAGNOSIS — M9902 Segmental and somatic dysfunction of thoracic region: Secondary | ICD-10-CM | POA: Diagnosis not present

## 2016-04-22 DIAGNOSIS — M791 Myalgia: Secondary | ICD-10-CM | POA: Diagnosis not present

## 2016-05-13 DIAGNOSIS — M47812 Spondylosis without myelopathy or radiculopathy, cervical region: Secondary | ICD-10-CM | POA: Diagnosis not present

## 2016-05-13 DIAGNOSIS — M9901 Segmental and somatic dysfunction of cervical region: Secondary | ICD-10-CM | POA: Diagnosis not present

## 2016-05-13 DIAGNOSIS — M47814 Spondylosis without myelopathy or radiculopathy, thoracic region: Secondary | ICD-10-CM | POA: Diagnosis not present

## 2016-05-13 DIAGNOSIS — M6240 Contracture of muscle, unspecified site: Secondary | ICD-10-CM | POA: Diagnosis not present

## 2016-05-13 DIAGNOSIS — M9902 Segmental and somatic dysfunction of thoracic region: Secondary | ICD-10-CM | POA: Diagnosis not present

## 2016-05-13 DIAGNOSIS — M791 Myalgia: Secondary | ICD-10-CM | POA: Diagnosis not present

## 2016-06-12 DIAGNOSIS — M9901 Segmental and somatic dysfunction of cervical region: Secondary | ICD-10-CM | POA: Diagnosis not present

## 2016-06-12 DIAGNOSIS — M47812 Spondylosis without myelopathy or radiculopathy, cervical region: Secondary | ICD-10-CM | POA: Diagnosis not present

## 2016-06-12 DIAGNOSIS — M9902 Segmental and somatic dysfunction of thoracic region: Secondary | ICD-10-CM | POA: Diagnosis not present

## 2016-06-12 DIAGNOSIS — M791 Myalgia: Secondary | ICD-10-CM | POA: Diagnosis not present

## 2016-06-12 DIAGNOSIS — M47814 Spondylosis without myelopathy or radiculopathy, thoracic region: Secondary | ICD-10-CM | POA: Diagnosis not present

## 2016-06-12 DIAGNOSIS — M6240 Contracture of muscle, unspecified site: Secondary | ICD-10-CM | POA: Diagnosis not present

## 2016-06-19 DIAGNOSIS — M9902 Segmental and somatic dysfunction of thoracic region: Secondary | ICD-10-CM | POA: Diagnosis not present

## 2016-06-19 DIAGNOSIS — M9901 Segmental and somatic dysfunction of cervical region: Secondary | ICD-10-CM | POA: Diagnosis not present

## 2016-06-19 DIAGNOSIS — M6240 Contracture of muscle, unspecified site: Secondary | ICD-10-CM | POA: Diagnosis not present

## 2016-06-19 DIAGNOSIS — M791 Myalgia: Secondary | ICD-10-CM | POA: Diagnosis not present

## 2016-06-19 DIAGNOSIS — M47812 Spondylosis without myelopathy or radiculopathy, cervical region: Secondary | ICD-10-CM | POA: Diagnosis not present

## 2016-06-19 DIAGNOSIS — M47814 Spondylosis without myelopathy or radiculopathy, thoracic region: Secondary | ICD-10-CM | POA: Diagnosis not present

## 2016-07-08 DIAGNOSIS — M47814 Spondylosis without myelopathy or radiculopathy, thoracic region: Secondary | ICD-10-CM | POA: Diagnosis not present

## 2016-07-08 DIAGNOSIS — M6240 Contracture of muscle, unspecified site: Secondary | ICD-10-CM | POA: Diagnosis not present

## 2016-07-08 DIAGNOSIS — M9901 Segmental and somatic dysfunction of cervical region: Secondary | ICD-10-CM | POA: Diagnosis not present

## 2016-07-08 DIAGNOSIS — M47812 Spondylosis without myelopathy or radiculopathy, cervical region: Secondary | ICD-10-CM | POA: Diagnosis not present

## 2016-07-08 DIAGNOSIS — M791 Myalgia: Secondary | ICD-10-CM | POA: Diagnosis not present

## 2016-07-08 DIAGNOSIS — M9902 Segmental and somatic dysfunction of thoracic region: Secondary | ICD-10-CM | POA: Diagnosis not present

## 2016-07-12 DIAGNOSIS — H401133 Primary open-angle glaucoma, bilateral, severe stage: Secondary | ICD-10-CM | POA: Diagnosis not present

## 2016-07-12 DIAGNOSIS — Z961 Presence of intraocular lens: Secondary | ICD-10-CM | POA: Diagnosis not present

## 2016-07-12 DIAGNOSIS — H04123 Dry eye syndrome of bilateral lacrimal glands: Secondary | ICD-10-CM | POA: Diagnosis not present

## 2016-07-29 DIAGNOSIS — M9901 Segmental and somatic dysfunction of cervical region: Secondary | ICD-10-CM | POA: Diagnosis not present

## 2016-07-29 DIAGNOSIS — M47814 Spondylosis without myelopathy or radiculopathy, thoracic region: Secondary | ICD-10-CM | POA: Diagnosis not present

## 2016-07-29 DIAGNOSIS — M9902 Segmental and somatic dysfunction of thoracic region: Secondary | ICD-10-CM | POA: Diagnosis not present

## 2016-07-29 DIAGNOSIS — M6240 Contracture of muscle, unspecified site: Secondary | ICD-10-CM | POA: Diagnosis not present

## 2016-07-29 DIAGNOSIS — M47812 Spondylosis without myelopathy or radiculopathy, cervical region: Secondary | ICD-10-CM | POA: Diagnosis not present

## 2016-07-29 DIAGNOSIS — M791 Myalgia: Secondary | ICD-10-CM | POA: Diagnosis not present

## 2016-08-02 DIAGNOSIS — E785 Hyperlipidemia, unspecified: Secondary | ICD-10-CM | POA: Diagnosis not present

## 2016-08-02 DIAGNOSIS — I5022 Chronic systolic (congestive) heart failure: Secondary | ICD-10-CM | POA: Diagnosis not present

## 2016-08-02 DIAGNOSIS — I481 Persistent atrial fibrillation: Secondary | ICD-10-CM | POA: Diagnosis not present

## 2016-08-02 DIAGNOSIS — I10 Essential (primary) hypertension: Secondary | ICD-10-CM | POA: Diagnosis not present

## 2016-08-26 DIAGNOSIS — M6240 Contracture of muscle, unspecified site: Secondary | ICD-10-CM | POA: Diagnosis not present

## 2016-08-26 DIAGNOSIS — M9902 Segmental and somatic dysfunction of thoracic region: Secondary | ICD-10-CM | POA: Diagnosis not present

## 2016-08-26 DIAGNOSIS — M47812 Spondylosis without myelopathy or radiculopathy, cervical region: Secondary | ICD-10-CM | POA: Diagnosis not present

## 2016-08-26 DIAGNOSIS — M9901 Segmental and somatic dysfunction of cervical region: Secondary | ICD-10-CM | POA: Diagnosis not present

## 2016-08-26 DIAGNOSIS — M47814 Spondylosis without myelopathy or radiculopathy, thoracic region: Secondary | ICD-10-CM | POA: Diagnosis not present

## 2016-08-26 DIAGNOSIS — M791 Myalgia: Secondary | ICD-10-CM | POA: Diagnosis not present

## 2016-09-23 DIAGNOSIS — M9902 Segmental and somatic dysfunction of thoracic region: Secondary | ICD-10-CM | POA: Diagnosis not present

## 2016-09-23 DIAGNOSIS — M47814 Spondylosis without myelopathy or radiculopathy, thoracic region: Secondary | ICD-10-CM | POA: Diagnosis not present

## 2016-09-23 DIAGNOSIS — M6240 Contracture of muscle, unspecified site: Secondary | ICD-10-CM | POA: Diagnosis not present

## 2016-09-23 DIAGNOSIS — M791 Myalgia: Secondary | ICD-10-CM | POA: Diagnosis not present

## 2016-09-23 DIAGNOSIS — M47812 Spondylosis without myelopathy or radiculopathy, cervical region: Secondary | ICD-10-CM | POA: Diagnosis not present

## 2016-09-23 DIAGNOSIS — M9901 Segmental and somatic dysfunction of cervical region: Secondary | ICD-10-CM | POA: Diagnosis not present

## 2016-10-07 DIAGNOSIS — D696 Thrombocytopenia, unspecified: Secondary | ICD-10-CM | POA: Diagnosis not present

## 2016-10-07 DIAGNOSIS — E7800 Pure hypercholesterolemia, unspecified: Secondary | ICD-10-CM | POA: Diagnosis not present

## 2016-10-07 DIAGNOSIS — I10 Essential (primary) hypertension: Secondary | ICD-10-CM | POA: Diagnosis not present

## 2016-10-07 DIAGNOSIS — E039 Hypothyroidism, unspecified: Secondary | ICD-10-CM | POA: Diagnosis not present

## 2016-10-14 DIAGNOSIS — Z1389 Encounter for screening for other disorder: Secondary | ICD-10-CM | POA: Diagnosis not present

## 2016-10-14 DIAGNOSIS — I481 Persistent atrial fibrillation: Secondary | ICD-10-CM | POA: Diagnosis not present

## 2016-10-14 DIAGNOSIS — I10 Essential (primary) hypertension: Secondary | ICD-10-CM | POA: Diagnosis not present

## 2016-10-14 DIAGNOSIS — D696 Thrombocytopenia, unspecified: Secondary | ICD-10-CM | POA: Diagnosis not present

## 2016-10-14 DIAGNOSIS — E039 Hypothyroidism, unspecified: Secondary | ICD-10-CM | POA: Diagnosis not present

## 2016-10-21 DIAGNOSIS — M9901 Segmental and somatic dysfunction of cervical region: Secondary | ICD-10-CM | POA: Diagnosis not present

## 2016-10-21 DIAGNOSIS — M47812 Spondylosis without myelopathy or radiculopathy, cervical region: Secondary | ICD-10-CM | POA: Diagnosis not present

## 2016-10-21 DIAGNOSIS — M6240 Contracture of muscle, unspecified site: Secondary | ICD-10-CM | POA: Diagnosis not present

## 2016-10-21 DIAGNOSIS — M791 Myalgia: Secondary | ICD-10-CM | POA: Diagnosis not present

## 2016-10-21 DIAGNOSIS — M47814 Spondylosis without myelopathy or radiculopathy, thoracic region: Secondary | ICD-10-CM | POA: Diagnosis not present

## 2016-10-21 DIAGNOSIS — M9902 Segmental and somatic dysfunction of thoracic region: Secondary | ICD-10-CM | POA: Diagnosis not present

## 2016-10-30 DIAGNOSIS — E782 Mixed hyperlipidemia: Secondary | ICD-10-CM | POA: Diagnosis not present

## 2016-10-30 DIAGNOSIS — I480 Paroxysmal atrial fibrillation: Secondary | ICD-10-CM | POA: Diagnosis not present

## 2016-10-30 DIAGNOSIS — I10 Essential (primary) hypertension: Secondary | ICD-10-CM | POA: Diagnosis not present

## 2016-11-15 DIAGNOSIS — Z961 Presence of intraocular lens: Secondary | ICD-10-CM | POA: Diagnosis not present

## 2016-11-15 DIAGNOSIS — H401133 Primary open-angle glaucoma, bilateral, severe stage: Secondary | ICD-10-CM | POA: Diagnosis not present

## 2016-11-15 DIAGNOSIS — H04123 Dry eye syndrome of bilateral lacrimal glands: Secondary | ICD-10-CM | POA: Diagnosis not present

## 2016-11-25 DIAGNOSIS — M791 Myalgia: Secondary | ICD-10-CM | POA: Diagnosis not present

## 2016-11-25 DIAGNOSIS — M9901 Segmental and somatic dysfunction of cervical region: Secondary | ICD-10-CM | POA: Diagnosis not present

## 2016-11-25 DIAGNOSIS — M47812 Spondylosis without myelopathy or radiculopathy, cervical region: Secondary | ICD-10-CM | POA: Diagnosis not present

## 2016-11-25 DIAGNOSIS — M9902 Segmental and somatic dysfunction of thoracic region: Secondary | ICD-10-CM | POA: Diagnosis not present

## 2016-11-25 DIAGNOSIS — M47814 Spondylosis without myelopathy or radiculopathy, thoracic region: Secondary | ICD-10-CM | POA: Diagnosis not present

## 2016-11-25 DIAGNOSIS — M6240 Contracture of muscle, unspecified site: Secondary | ICD-10-CM | POA: Diagnosis not present

## 2016-12-23 DIAGNOSIS — M791 Myalgia: Secondary | ICD-10-CM | POA: Diagnosis not present

## 2016-12-23 DIAGNOSIS — M9902 Segmental and somatic dysfunction of thoracic region: Secondary | ICD-10-CM | POA: Diagnosis not present

## 2016-12-23 DIAGNOSIS — M47812 Spondylosis without myelopathy or radiculopathy, cervical region: Secondary | ICD-10-CM | POA: Diagnosis not present

## 2016-12-23 DIAGNOSIS — M9901 Segmental and somatic dysfunction of cervical region: Secondary | ICD-10-CM | POA: Diagnosis not present

## 2016-12-23 DIAGNOSIS — M47814 Spondylosis without myelopathy or radiculopathy, thoracic region: Secondary | ICD-10-CM | POA: Diagnosis not present

## 2016-12-23 DIAGNOSIS — M6240 Contracture of muscle, unspecified site: Secondary | ICD-10-CM | POA: Diagnosis not present

## 2017-01-20 DIAGNOSIS — M47814 Spondylosis without myelopathy or radiculopathy, thoracic region: Secondary | ICD-10-CM | POA: Diagnosis not present

## 2017-01-20 DIAGNOSIS — M9901 Segmental and somatic dysfunction of cervical region: Secondary | ICD-10-CM | POA: Diagnosis not present

## 2017-01-20 DIAGNOSIS — M47812 Spondylosis without myelopathy or radiculopathy, cervical region: Secondary | ICD-10-CM | POA: Diagnosis not present

## 2017-01-20 DIAGNOSIS — M9902 Segmental and somatic dysfunction of thoracic region: Secondary | ICD-10-CM | POA: Diagnosis not present

## 2017-02-05 DIAGNOSIS — I480 Paroxysmal atrial fibrillation: Secondary | ICD-10-CM | POA: Diagnosis not present

## 2017-02-05 DIAGNOSIS — I10 Essential (primary) hypertension: Secondary | ICD-10-CM | POA: Diagnosis not present

## 2017-02-05 DIAGNOSIS — E782 Mixed hyperlipidemia: Secondary | ICD-10-CM | POA: Diagnosis not present

## 2017-02-19 DIAGNOSIS — M47812 Spondylosis without myelopathy or radiculopathy, cervical region: Secondary | ICD-10-CM | POA: Diagnosis not present

## 2017-02-19 DIAGNOSIS — M9901 Segmental and somatic dysfunction of cervical region: Secondary | ICD-10-CM | POA: Diagnosis not present

## 2017-02-19 DIAGNOSIS — M47814 Spondylosis without myelopathy or radiculopathy, thoracic region: Secondary | ICD-10-CM | POA: Diagnosis not present

## 2017-02-19 DIAGNOSIS — M9902 Segmental and somatic dysfunction of thoracic region: Secondary | ICD-10-CM | POA: Diagnosis not present

## 2017-03-17 DIAGNOSIS — M47814 Spondylosis without myelopathy or radiculopathy, thoracic region: Secondary | ICD-10-CM | POA: Diagnosis not present

## 2017-03-17 DIAGNOSIS — M9901 Segmental and somatic dysfunction of cervical region: Secondary | ICD-10-CM | POA: Diagnosis not present

## 2017-03-17 DIAGNOSIS — M47812 Spondylosis without myelopathy or radiculopathy, cervical region: Secondary | ICD-10-CM | POA: Diagnosis not present

## 2017-03-17 DIAGNOSIS — Z23 Encounter for immunization: Secondary | ICD-10-CM | POA: Diagnosis not present

## 2017-03-17 DIAGNOSIS — M9902 Segmental and somatic dysfunction of thoracic region: Secondary | ICD-10-CM | POA: Diagnosis not present

## 2017-03-18 DIAGNOSIS — H401133 Primary open-angle glaucoma, bilateral, severe stage: Secondary | ICD-10-CM | POA: Diagnosis not present

## 2017-03-18 DIAGNOSIS — Z961 Presence of intraocular lens: Secondary | ICD-10-CM | POA: Diagnosis not present

## 2017-03-18 DIAGNOSIS — H04123 Dry eye syndrome of bilateral lacrimal glands: Secondary | ICD-10-CM | POA: Diagnosis not present

## 2017-04-09 DIAGNOSIS — I10 Essential (primary) hypertension: Secondary | ICD-10-CM | POA: Diagnosis not present

## 2017-04-09 DIAGNOSIS — I482 Chronic atrial fibrillation: Secondary | ICD-10-CM | POA: Diagnosis not present

## 2017-04-14 DIAGNOSIS — M47812 Spondylosis without myelopathy or radiculopathy, cervical region: Secondary | ICD-10-CM | POA: Diagnosis not present

## 2017-04-14 DIAGNOSIS — M9902 Segmental and somatic dysfunction of thoracic region: Secondary | ICD-10-CM | POA: Diagnosis not present

## 2017-04-14 DIAGNOSIS — M9901 Segmental and somatic dysfunction of cervical region: Secondary | ICD-10-CM | POA: Diagnosis not present

## 2017-04-14 DIAGNOSIS — M47814 Spondylosis without myelopathy or radiculopathy, thoracic region: Secondary | ICD-10-CM | POA: Diagnosis not present

## 2017-04-23 DIAGNOSIS — Z0000 Encounter for general adult medical examination without abnormal findings: Secondary | ICD-10-CM | POA: Diagnosis not present

## 2017-04-25 DIAGNOSIS — I482 Chronic atrial fibrillation: Secondary | ICD-10-CM | POA: Diagnosis not present

## 2017-05-12 DIAGNOSIS — M47814 Spondylosis without myelopathy or radiculopathy, thoracic region: Secondary | ICD-10-CM | POA: Diagnosis not present

## 2017-05-12 DIAGNOSIS — M9902 Segmental and somatic dysfunction of thoracic region: Secondary | ICD-10-CM | POA: Diagnosis not present

## 2017-05-12 DIAGNOSIS — M47812 Spondylosis without myelopathy or radiculopathy, cervical region: Secondary | ICD-10-CM | POA: Diagnosis not present

## 2017-05-12 DIAGNOSIS — M9901 Segmental and somatic dysfunction of cervical region: Secondary | ICD-10-CM | POA: Diagnosis not present
